# Patient Record
Sex: Male | Born: 1975 | Race: Black or African American | Hispanic: No | Smoking: Current every day smoker
Health system: Southern US, Community
[De-identification: ages and names within clinical notes are randomized; demographics above are authoritative.]

## PROBLEM LIST (undated history)

## (undated) DIAGNOSIS — Z87442 Personal history of urinary calculi: Secondary | ICD-10-CM

## (undated) DIAGNOSIS — I10 Essential (primary) hypertension: Secondary | ICD-10-CM

---

## 2009-11-08 ENCOUNTER — Inpatient Hospital Stay: Payer: Self-pay | Admitting: Internal Medicine

## 2009-12-31 ENCOUNTER — Emergency Department: Payer: Self-pay | Admitting: Emergency Medicine

## 2011-08-07 IMAGING — CT CT HEAD WITHOUT CONTRAST
2 series · 16 of 30 positions shown, 20 images · non-contrast
Comparison: none

REASON FOR EXAM: seizure
COMMENTS:

[Series 2: without · axial · non-contrast · 0.42mm/px · z∈[+448,+572]mm · 13 of 31 slices shown, 17 images]
[im 3/31  brain]
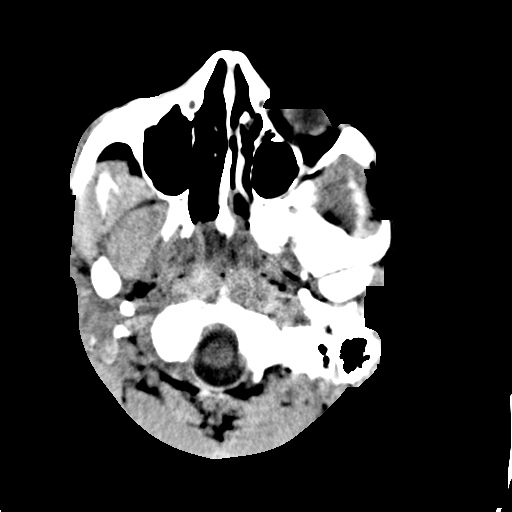
[im 3/31  bone]
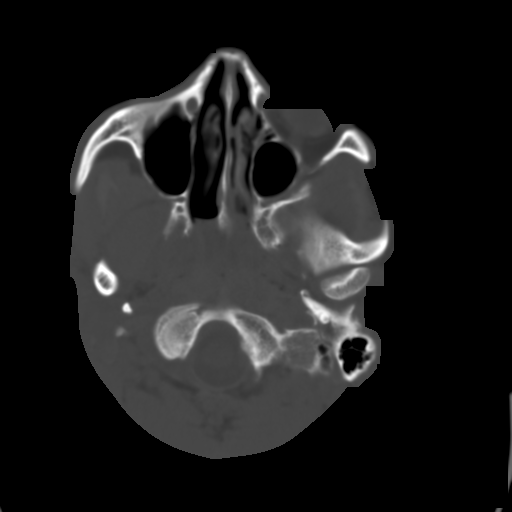
[im 5/31  brain]
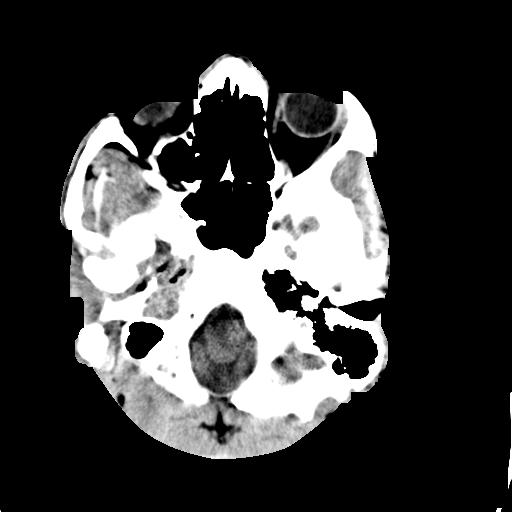
[im 7/31  brain]
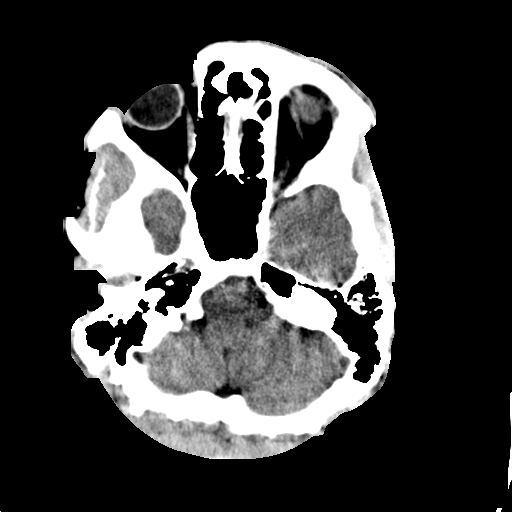
[im 9/31  brain]
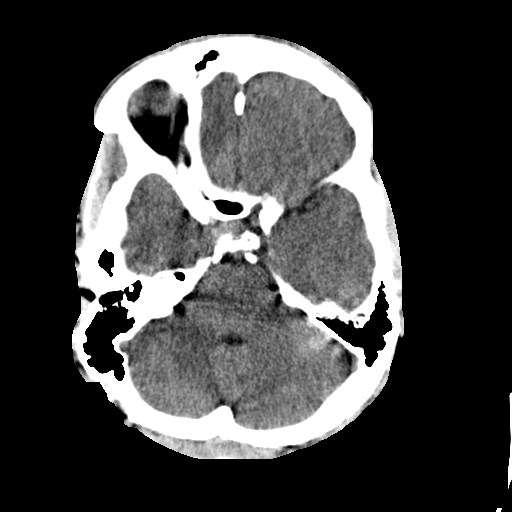
[im 11/31  brain]
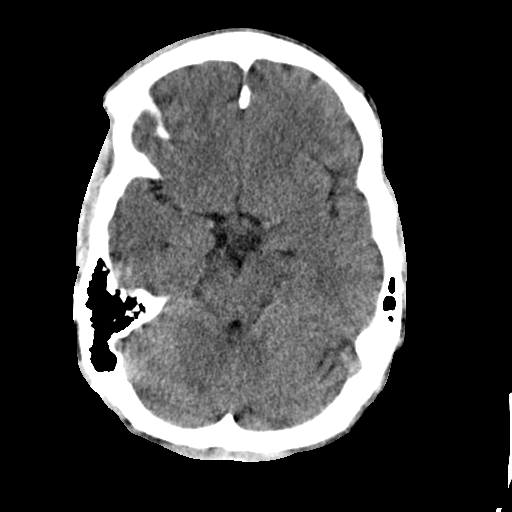
[im 11/31  bone]
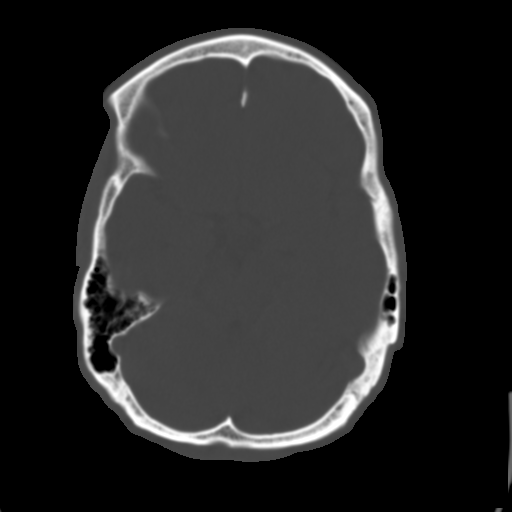
[im 13/31  brain]
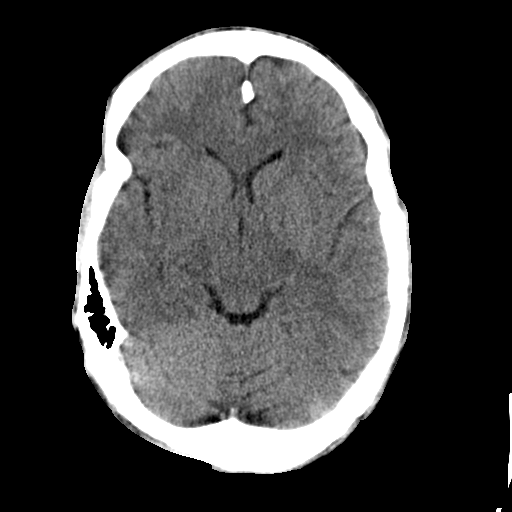
[im 16/31  brain]
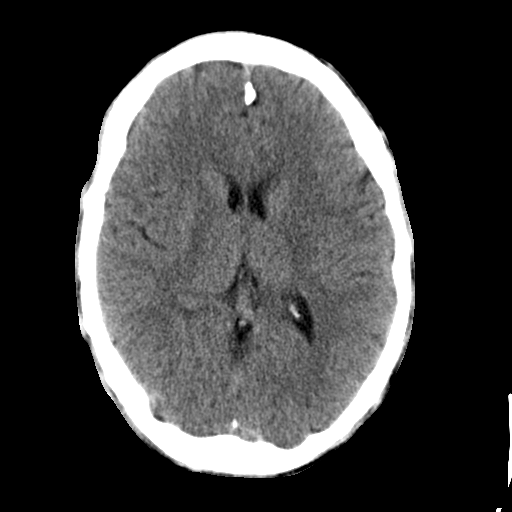
[im 18/31  brain]
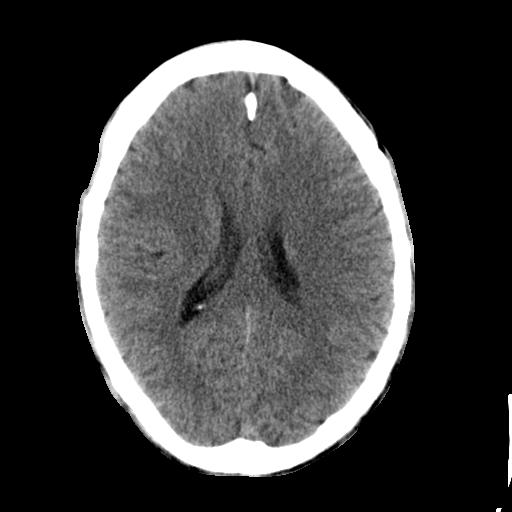
[im 20/31  brain]
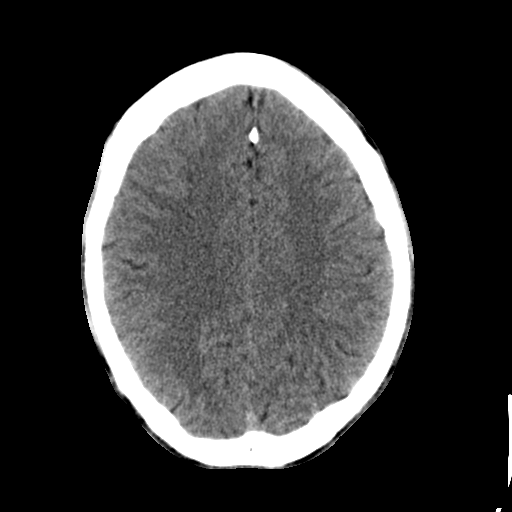
[im 20/31  bone]
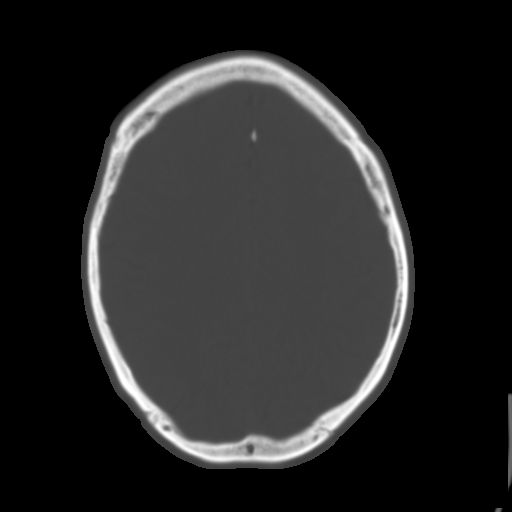
[im 22/31  brain]
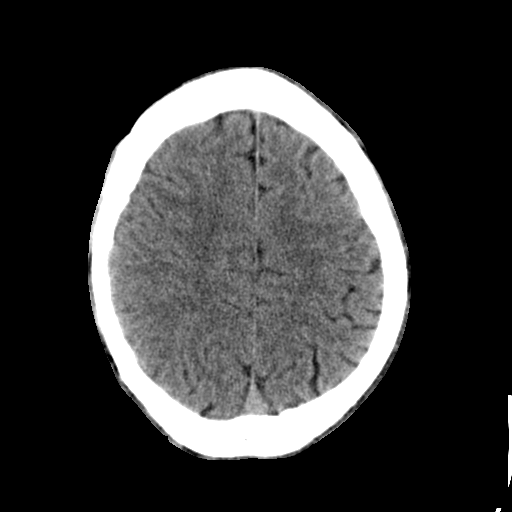
[im 24/31  brain]
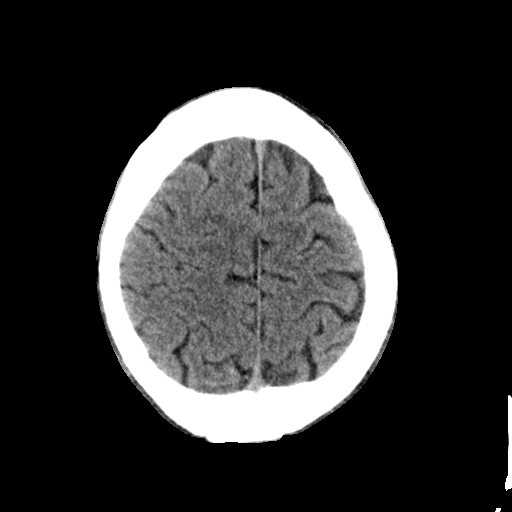
[im 26/31  brain]
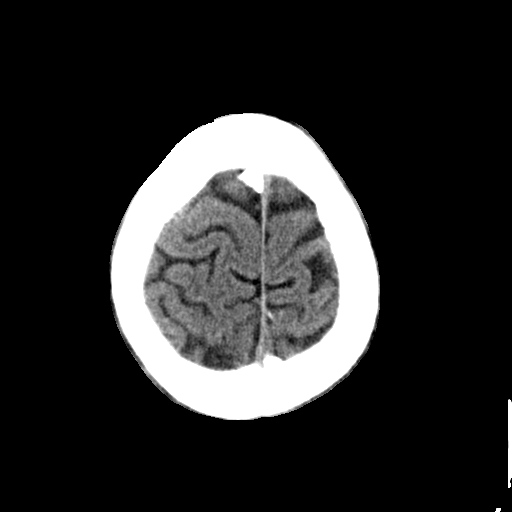
[im 28/31  brain]
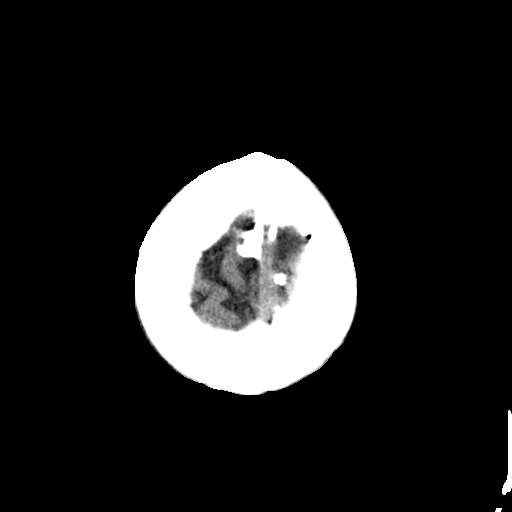
[im 28/31  bone]
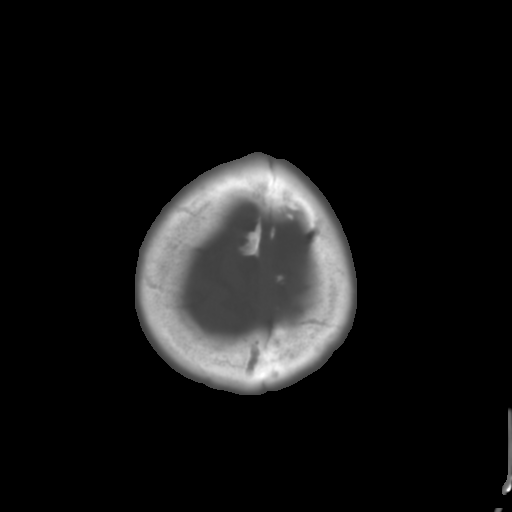

[Series 3: bone · axial · 0.42mm/px · z∈[+448,+488]mm · 3 of 31 slices shown]
[im 3/31  bone]
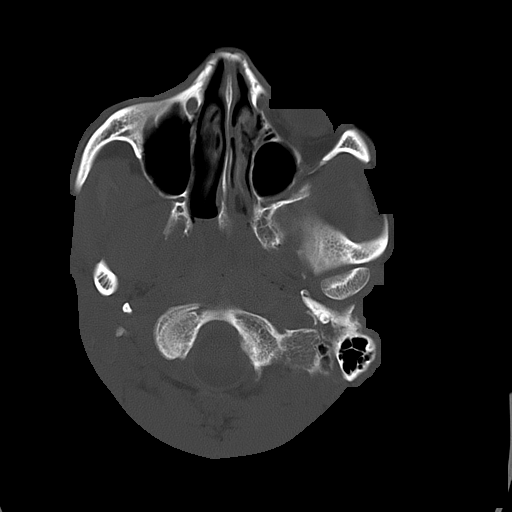
[im 7/31  bone]
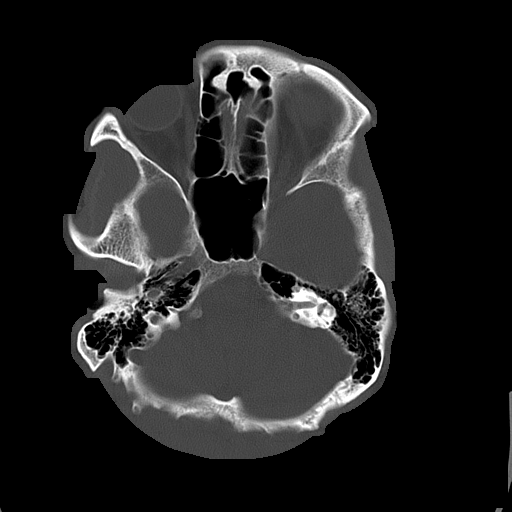
[im 11/31  bone]
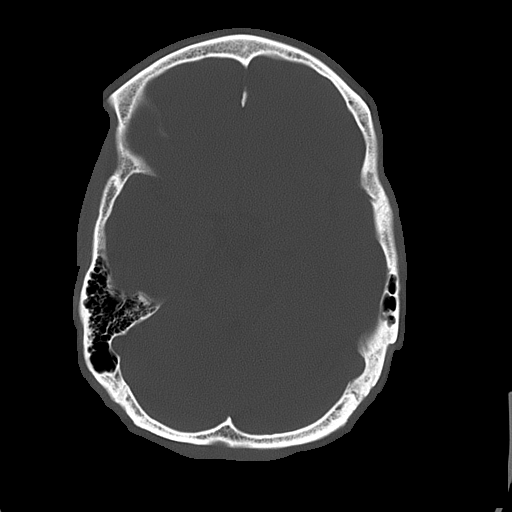

[16 of 30 positions shown; findings below may reference images not displayed]

PROCEDURE:     CT  - CT HEAD WITHOUT CONTRAST  - November 08, 2009  [DATE]

RESULT:     Axial noncontrast CT scanning was performed through the brain at
5 mm intervals and slice thicknesses.

The ventricles are normal in size and position. There is no intracranial
hemorrhage nor intracranial mass effect. The cerebellum and brainstem are
normal in density. There are no findings to suggest an evolving ischemic
event. I do not see evidence of increased intracranial pressure. At bone
window settings the observed portions of the paranasal sinuses and mastoid
air cells are clear. There is no evidence of an acute skull fracture.
IMPRESSION: I see no acute intracranial abnormality.

## 2011-08-08 IMAGING — CR DG CHEST 1V PORT
1 series · 1 of 1 positions shown · non-contrast
Comparison: none

REASON FOR EXAM: COMMENTS:

[view not recorded]
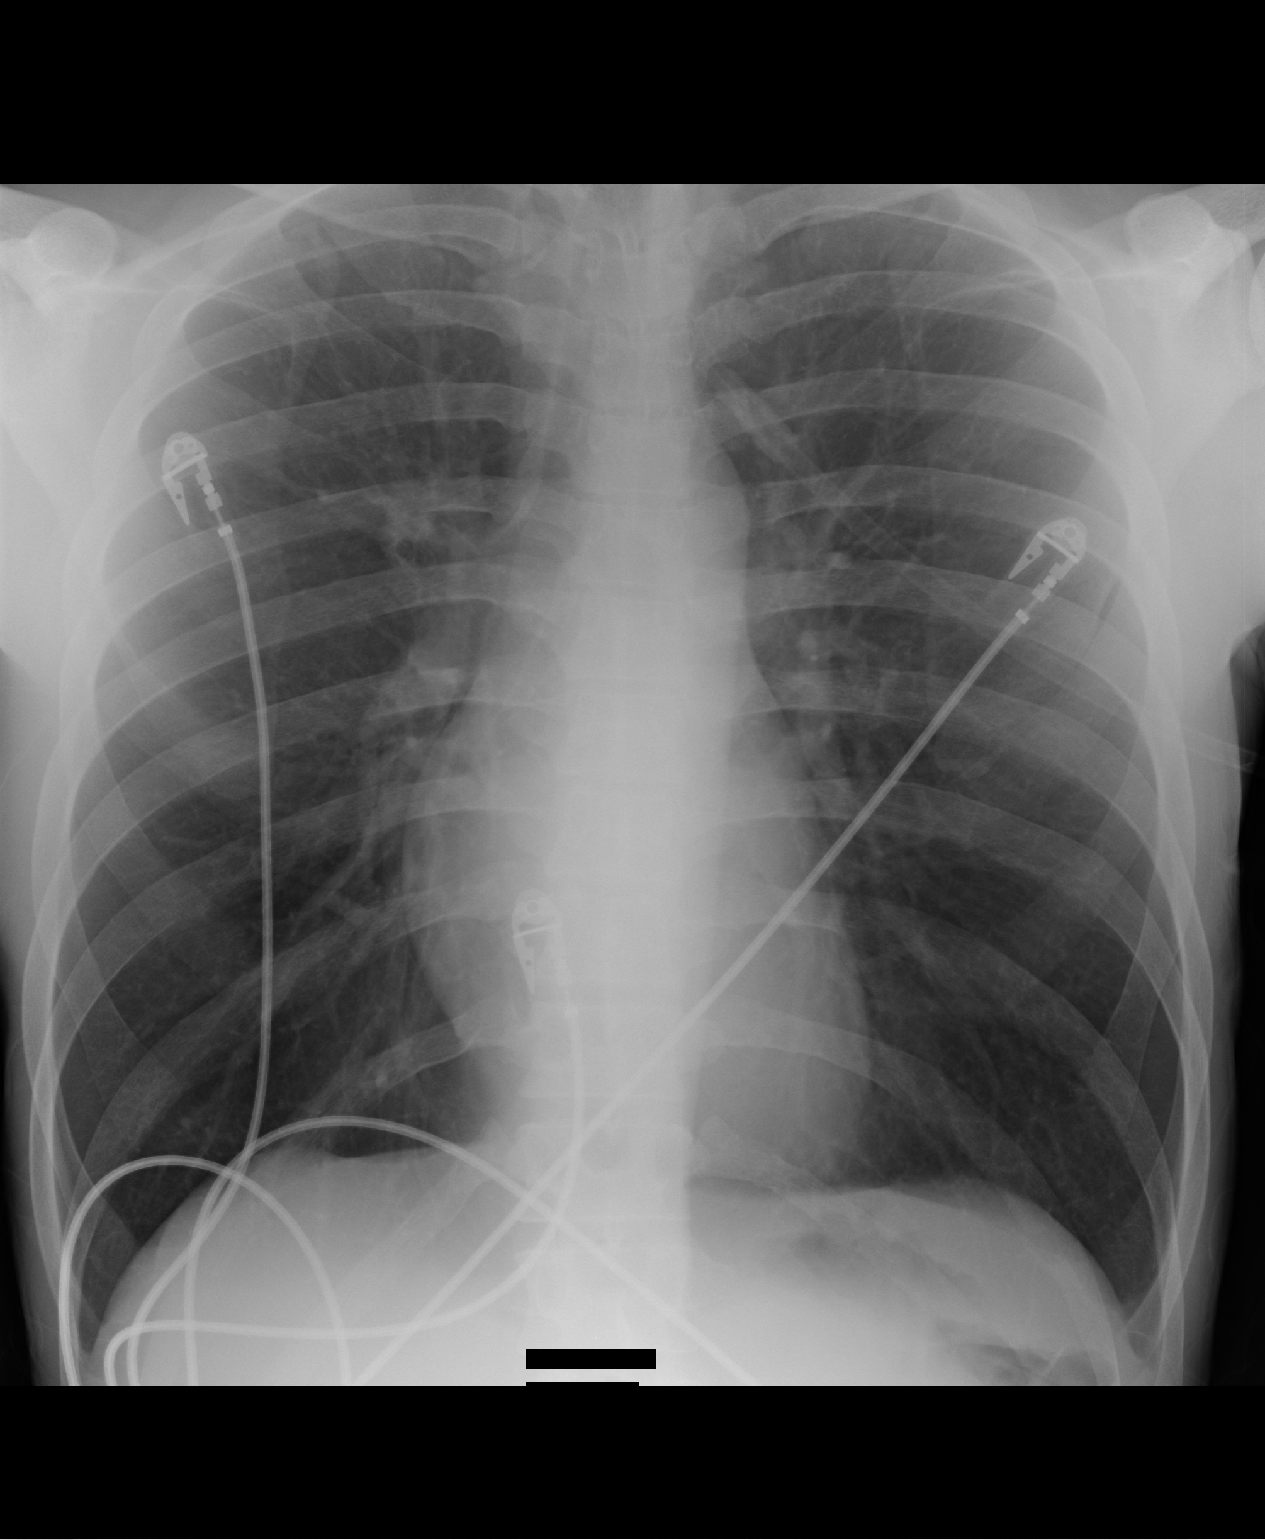

[1 of 1 positions shown; findings below may reference images not displayed]

PROCEDURE:     DXR - DXR PORTABLE CHEST SINGLE VIEW  - November 09, 2009  [DATE]

RESULT:      Comparison is made to the study of 09/14/2009.

The patient has undergone interval extubation of the trachea. The lungs
remain hyperinflated. The cardiac silhouette is normal in size. The
pulmonary vascularity is not engorged.
IMPRESSION: The findings are consistent with COPD. I do not see
evidence of pneumonia.

Addendum: The comparison study date indicated above is incorrect. The
previous chest x-ray was performed on 08 November, 2009.

## 2021-03-07 ENCOUNTER — Other Ambulatory Visit: Payer: Self-pay

## 2021-03-07 ENCOUNTER — Emergency Department
Admission: EM | Admit: 2021-03-07 | Discharge: 2021-03-07 | Disposition: A | Discharge status: Home / Self Care | Payer: Self-pay | Attending: Emergency Medicine | Admitting: Emergency Medicine

## 2021-03-07 ENCOUNTER — Encounter: Payer: Self-pay | Admitting: Emergency Medicine

## 2021-03-07 DIAGNOSIS — R21 Rash and other nonspecific skin eruption: Secondary | ICD-10-CM | POA: Insufficient documentation

## 2021-03-07 DIAGNOSIS — L539 Erythematous condition, unspecified: Secondary | ICD-10-CM | POA: Insufficient documentation

## 2021-03-07 MED ORDER — CEPHALEXIN 500 MG PO CAPS: 500.0000 mg | ORAL_CAPSULE | Freq: Three times a day (TID) | ORAL | 0 refills | Status: AC

## 2021-03-07 MED ORDER — DOXYCYCLINE MONOHYDRATE 100 MG PO TABS: 100.0000 mg | ORAL_TABLET | Freq: Two times a day (BID) | ORAL | 0 refills | Status: AC

## 2021-03-07 NOTE — ED Provider Notes (Signed)
[facility]-EMERGENCY DEPARTMENT  ____________________________________________  Time seen: Approximately 6:12 PM  I have reviewed the triage vital signs and the nursing notes.   HISTORY  Chief Complaint Rash   Historian Patient     HPI Jordan Dunn is a 45 y.o. male patient has erythematous, maculopapular rash of the face with honey colored crust primarily in chin and cheeks.  Patient reports that rash originally started with 2 pustules.  No fever or chills.  Patient has been applying calamine lotion.   History reviewed. No pertinent past medical history.   Immunizations up to date:  Yes.     History reviewed. No pertinent past medical history.  There are no problems to display for this patient.   History reviewed. No pertinent surgical history.  Prior to Admission medications   Medication Sig Start Date End Date Taking? Authorizing Provider  cephALEXin (KEFLEX) 500 MG capsule Take 1 capsule (500 mg total) by mouth 3 (three) times daily for 7 days. 03/07/21 03/14/21 Yes Pia Mau M, PA-C  doxycycline (ADOXA) 100 MG tablet Take 1 tablet (100 mg total) by mouth 2 (two) times daily for 7 days. 03/07/21 03/14/21 Yes Orvil Feil, PA-C    Allergies Patient has no allergy information on record.  No family history on file.  Social History     Review of Systems  Constitutional: No fever/chills Eyes:  No discharge ENT: No upper respiratory complaints. Respiratory: no cough. No SOB/ use of accessory muscles to breath Gastrointestinal:   No nausea, no vomiting.  No diarrhea.  No constipation. Musculoskeletal: Negative for musculoskeletal pain. Skin: Patient has facial rash.    ____________________________________________   PHYSICAL EXAM:  VITAL SIGNS: ED Triage Vitals  Enc Vitals Group     BP 03/07/21 1444 (!) 183/108     Pulse Rate 03/07/21 1444 93     Resp 03/07/21 1444 20     Temp 03/07/21 1444 99 F (37.2 C)     Temp Source 03/07/21 1444 Oral      SpO2 03/07/21 1444 96 %     Weight 03/07/21 1432 150 lb (68 kg)     Height 03/07/21 1432 6\' 2"  (1.88 m)     Head Circumference --      Peak Flow --      Pain Score 03/07/21 1432 10     Pain Loc --      Pain Edu? --      Excl. in GC? --      Constitutional: Alert and oriented. Well appearing and in no acute distress. Eyes: Conjunctivae are normal. PERRL. EOMI. Head: Atraumatic. ENT:      Nose: No congestion/rhinnorhea.      Mouth/Throat: Mucous membranes are moist.  Neck: No stridor.  No cervical spine tenderness to palpation. Cardiovascular: Normal rate, regular rhythm. Normal S1 and S2.  Good peripheral circulation. Respiratory: Normal respiratory effort without tachypnea or retractions. Lungs CTAB. Good air entry to the bases with no decreased or absent breath sounds Gastrointestinal: Bowel sounds x 4 quadrants. Soft and nontender to palpation. No guarding or rigidity. No distention. Musculoskeletal: Full range of motion to all extremities. No obvious deformities noted Neurologic:  Normal for age. No gross focal neurologic deficits are appreciated.  Skin: Patient has erythematous, maculopapular rash of face with honey colored crust. Psychiatric: Mood and affect are normal for age. Speech and behavior are normal.   ____________________________________________   LABS (all labs ordered are listed, but only abnormal results are displayed)  Labs Reviewed -  No data to display ____________________________________________  EKG   ____________________________________________  RADIOLOGY   No results found.  ____________________________________________    PROCEDURES  Procedure(s) performed:     Procedures     Medications - No data to display   ____________________________________________   INITIAL IMPRESSION / ASSESSMENT AND PLAN / ED COURSE  Pertinent labs & imaging results that were available during my care of the patient were reviewed by me and considered  in my medical decision making (see chart for details).      Assessment and plan Facial rash 45 year old male presents to the emergency department with facial rash consistent with impetigo.  Will treat with both doxycycline and Keflex.  A work note was provided.  All patient questions were answered.     ____________________________________________  FINAL CLINICAL IMPRESSION(S) / ED DIAGNOSES  Final diagnoses:  Rash      NEW MEDICATIONS STARTED DURING THIS VISIT:  ED Discharge Orders          Ordered    cephALEXin (KEFLEX) 500 MG capsule  3 times daily        03/07/21 1803    doxycycline (ADOXA) 100 MG tablet  2 times daily        03/07/21 1803                This chart was dictated using voice recognition software/Dragon. Despite best efforts to proofread, errors can occur which can change the meaning. Any change was purely unintentional.     Orvil Feil, PA-C 03/07/21 1814    Phineas Semen, MD 03/07/21 1944

## 2021-03-07 NOTE — ED Triage Notes (Signed)
Pt reports 4 days ago started with a rash on his face. Pt reports does not itch and looks like little sores. Pt reports someone told him it may be a spider bite and to come to the ED

## 2021-03-07 NOTE — Discharge Instructions (Addendum)
Take doxycycline twice daily for the next 7 days. Take Keflex 4 times daily for the next 7 days. 

## 2023-05-03 ENCOUNTER — Other Ambulatory Visit: Payer: Self-pay

## 2023-05-03 ENCOUNTER — Encounter: Payer: Self-pay | Admitting: Emergency Medicine

## 2023-05-03 ENCOUNTER — Emergency Department
Admission: EM | Admit: 2023-05-03 | Discharge: 2023-05-03 | Disposition: A | Discharge status: Home / Self Care | Payer: Self-pay | Attending: Emergency Medicine | Admitting: Emergency Medicine

## 2023-05-03 DIAGNOSIS — I10 Essential (primary) hypertension: Secondary | ICD-10-CM | POA: Insufficient documentation

## 2023-05-03 DIAGNOSIS — R040 Epistaxis: Secondary | ICD-10-CM | POA: Insufficient documentation

## 2023-05-03 MED ORDER — LORAZEPAM 1 MG PO TABS
1.0000 mg | ORAL_TABLET | Freq: Once | ORAL | Status: AC
  Administered 2023-05-03: 1 mg via ORAL
  Filled 2023-05-03: qty 1

## 2023-05-03 MED ORDER — CLONIDINE HCL 0.1 MG PO TABS
0.1000 mg | ORAL_TABLET | Freq: Once | ORAL | Status: AC
  Administered 2023-05-03: 0.1 mg via ORAL
  Filled 2023-05-03: qty 1

## 2023-05-03 MED ORDER — OXYMETAZOLINE HCL 0.05 % NA SOLN
2.0000 | Freq: Once | NASAL | Status: AC
  Administered 2023-05-03: 2 via NASAL
  Filled 2023-05-03: qty 30

## 2023-05-03 MED ORDER — AMLODIPINE BESYLATE 5 MG PO TABS: 5.0000 mg | ORAL_TABLET | Freq: Every day | ORAL | 5 refills | Status: DC

## 2023-05-03 NOTE — ED Notes (Signed)
Patient discharged at this time. Ambulated to lobby with independent and steady gait. Breathing unlabored speaking in full sentences. Verbalized understanding of all discharge, follow up, and medication teaching. Discharged homed with all belongings.

## 2023-05-03 NOTE — Discharge Instructions (Signed)
Please begin taking her blood pressure medication as prescribed.  You should be receiving a phone call to help arrange a primary care appointment.  Return to the emergency department for any further significant bleeding or any other symptom personally concerning to yourself.

## 2023-05-03 NOTE — ED Provider Notes (Signed)
[facility] Provider Note    Event Date/Time   First MD Initiated Contact with Patient 05/03/23 1824     (approximate)  History   Chief Complaint: Epistaxis  HPI  Jordan Dunn is a 47 y.o. male with no past medical history presents to the emergency department for a nosebleed.  According to the patient earlier this morning he states he woke up to his nose bleeding, it stopped after 30 minutes or so and had remained stopped until around 4:00 today when he began bleeding once again.  Patient blood Kleenex is in his nose and states that has not restarted significantly again.  Patient found to be significantly hypertensive upon arrival 225/136.  Patient has been told in the past that he has high blood pressure but states it was never high enough to need any medication.  Patient denies any chest pain or shortness of breath.  No headache.  Patient is anxious appearing regarding the nosebleed.  Physical Exam   Triage Vital Signs: ED Triage Vitals  Encounter Vitals Group     BP 05/03/23 1645 (!) 225/136     Systolic BP Percentile --      Diastolic BP Percentile --      Pulse Rate 05/03/23 1645 97     Resp 05/03/23 1645 18     Temp 05/03/23 1645 97.6 F (36.4 C)     Temp Source 05/03/23 1645 Oral     SpO2 05/03/23 1645 99 %     Weight 05/03/23 1644 165 lb (74.8 kg)     Height 05/03/23 1644 6\' 2"  (1.88 m)     Head Circumference --      Peak Flow --      Pain Score 05/03/23 1644 0     Pain Loc --      Pain Education --      Exclude from Growth Chart --     Most recent vital signs: Vitals:   05/03/23 1645  BP: (!) 225/136  Pulse: 97  Resp: 18  Temp: 97.6 F (36.4 C)  SpO2: 99%    General: Awake, no distress.  CV:  Good peripheral perfusion.  Regular rate and rhythm  Resp:  Normal effort.  Equal breath sounds bilaterally.  Abd:  No distention.   Other:  No bleeding currently.  Dried blood bilaterally.   ED Results / Procedures / Treatments    MEDICATIONS ORDERED IN ED: Medications  cloNIDine (CATAPRES) tablet 0.1 mg (has no administration in time range)  LORazepam (ATIVAN) tablet 1 mg (has no administration in time range)  oxymetazoline (AFRIN) 0.05 % nasal spray 2 spray (has no administration in time range)     IMPRESSION / MDM / ASSESSMENT AND PLAN / ED COURSE  I reviewed the triage vital signs and the nursing notes.  Patient's presentation is most consistent with acute illness / injury with system symptoms.  Patient presents the emergency department for nosebleed intermittent since this morning.  Patient found to be quite hypertensive 225/136.  Patient has been told in the past he had a high blood pressure but states it was not high enough to need medications.  Patient denies any drug use besides marijuana.  No prescription medications.  Patient is anxious appearing during my evaluation likely contributing to his hypertension which could very likely be contributing to his epistaxis.  Currently hemostatic we will apply Afrin to promote continued hemostasis.  Will dose 0.1 mg of clonidine and 1 mg of Ativan orally  and reassess.  Patient agreeable to plan of care.  Patient's blood pressure is improving currently 162/107.  Has not had any further bleeding.  Will discharge home on amlodipine 5 mg daily.  Discussed with the patient to check his blood pressure every 3 to 4 days at the pharmacy to ensure it does not drop below 120 if it does he will discontinue the medication otherwise I will put in a referral to a new primary care doctor.  Patient agreeable to plan of care.  FINAL CLINICAL IMPRESSION(S) / ED DIAGNOSES   Uncontrolled hypertension Epistaxis    Note:  This document was prepared using Dragon voice recognition software and may include unintentional dictation errors.   Minna Antis, MD 05/03/23 2013

## 2023-05-03 NOTE — ED Notes (Signed)
See triage note  Presents with nose bleed  States this is the 2nd one today Denies any trauma

## 2023-05-03 NOTE — ED Triage Notes (Signed)
Patient to ED via POV for nosebleed. Second one today- started about 1 hr ago in both nostrils. Denies blood thinners

## 2024-04-18 ENCOUNTER — Encounter: Payer: Self-pay | Admitting: Intensive Care

## 2024-04-18 ENCOUNTER — Observation Stay: Payer: Self-pay | Admitting: Certified Registered Nurse Anesthetist

## 2024-04-18 ENCOUNTER — Encounter: Admission: EM | Disposition: A | Payer: Self-pay | Source: Home / Self Care | Attending: Student

## 2024-04-18 ENCOUNTER — Other Ambulatory Visit: Payer: Self-pay

## 2024-04-18 ENCOUNTER — Observation Stay: Payer: Self-pay

## 2024-04-18 ENCOUNTER — Inpatient Hospital Stay
Admission: EM | Admit: 2024-04-18 | Discharge: 2024-04-20 | DRG: 854 | Disposition: A | Discharge status: Home / Self Care | Payer: Self-pay | Attending: Student | Admitting: Student

## 2024-04-18 ENCOUNTER — Emergency Department: Payer: Self-pay

## 2024-04-18 DIAGNOSIS — N139 Obstructive and reflux uropathy, unspecified: Secondary | ICD-10-CM | POA: Insufficient documentation

## 2024-04-18 DIAGNOSIS — I10 Essential (primary) hypertension: Secondary | ICD-10-CM | POA: Diagnosis present

## 2024-04-18 DIAGNOSIS — N201 Calculus of ureter: Secondary | ICD-10-CM

## 2024-04-18 DIAGNOSIS — N179 Acute kidney failure, unspecified: Secondary | ICD-10-CM | POA: Diagnosis present

## 2024-04-18 DIAGNOSIS — A419 Sepsis, unspecified organism: Principal | ICD-10-CM | POA: Diagnosis present

## 2024-04-18 DIAGNOSIS — N10 Acute tubulo-interstitial nephritis: Secondary | ICD-10-CM | POA: Insufficient documentation

## 2024-04-18 DIAGNOSIS — N12 Tubulo-interstitial nephritis, not specified as acute or chronic: Principal | ICD-10-CM

## 2024-04-18 DIAGNOSIS — N200 Calculus of kidney: Secondary | ICD-10-CM

## 2024-04-18 DIAGNOSIS — N136 Pyonephrosis: Secondary | ICD-10-CM | POA: Diagnosis present

## 2024-04-18 DIAGNOSIS — Z79899 Other long term (current) drug therapy: Secondary | ICD-10-CM

## 2024-04-18 DIAGNOSIS — K5909 Other constipation: Secondary | ICD-10-CM | POA: Diagnosis present

## 2024-04-18 HISTORY — DX: Essential (primary) hypertension: I10

## 2024-04-18 HISTORY — PX: CYSTOSCOPY W/ URETERAL STENT PLACEMENT: SHX1429

## 2024-04-18 LAB — HIV ANTIBODY (ROUTINE TESTING W REFLEX): HIV Screen 4th Generation wRfx: NONREACTIVE

## 2024-04-18 LAB — URINALYSIS, ROUTINE W REFLEX MICROSCOPIC
Bilirubin Urine: NEGATIVE
Glucose, UA: NEGATIVE mg/dL
Hgb urine dipstick: NEGATIVE
Ketones, ur: NEGATIVE mg/dL
Leukocytes,Ua: NEGATIVE
Nitrite: NEGATIVE
Protein, ur: 30 mg/dL — AB
Specific Gravity, Urine: 1.033 — ABNORMAL HIGH (ref 1.005–1.030)
pH: 5 (ref 5.0–8.0)

## 2024-04-18 LAB — COMPREHENSIVE METABOLIC PANEL WITH GFR
ALT: 26 U/L (ref 0–44)
AST: 21 U/L (ref 15–41)
Albumin: 3.9 g/dL (ref 3.5–5.0)
Alkaline Phosphatase: 124 U/L (ref 38–126)
Anion gap: 12 (ref 5–15)
BUN: 25 mg/dL — ABNORMAL HIGH (ref 6–20)
CO2: 26 mmol/L (ref 22–32)
Calcium: 9.2 mg/dL (ref 8.9–10.3)
Chloride: 98 mmol/L (ref 98–111)
Creatinine, Ser: 1.85 mg/dL — ABNORMAL HIGH (ref 0.61–1.24)
GFR, Estimated: 45 mL/min — ABNORMAL LOW (ref >60)
Glucose, Bld: 119 mg/dL — ABNORMAL HIGH (ref 70–99)
Potassium: 4.1 mmol/L (ref 3.5–5.1)
Sodium: 135 mmol/L (ref 135–145)
Total Bilirubin: 0.6 mg/dL (ref 0.0–1.2)
Total Protein: 8.1 g/dL (ref 6.5–8.1)

## 2024-04-18 LAB — CBC
HCT: 39.1 % (ref 39.0–52.0)
Hemoglobin: 13.2 g/dL (ref 13.0–17.0)
MCH: 30.1 pg (ref 26.0–34.0)
MCHC: 33.8 g/dL (ref 30.0–36.0)
MCV: 89.3 fL (ref 80.0–100.0)
Platelets: 289 K/uL (ref 150–400)
RBC: 4.38 MIL/uL (ref 4.22–5.81)
RDW: 13.8 % (ref 11.5–15.5)
WBC: 22.3 K/uL — ABNORMAL HIGH (ref 4.0–10.5)
nRBC: 0 % (ref 0.0–0.2)

## 2024-04-18 LAB — LACTIC ACID, PLASMA
Lactic Acid, Venous: 0.8 mmol/L (ref 0.5–1.9)
Lactic Acid, Venous: 0.9 mmol/L (ref 0.5–1.9)

## 2024-04-18 LAB — LIPASE, BLOOD: Lipase: 21 U/L (ref 11–51)

## 2024-04-18 MED ORDER — HYDROMORPHONE HCL 1 MG/ML IJ SOLN: 0.5000 mg | INTRAMUSCULAR | Status: DC | PRN

## 2024-04-18 MED ORDER — INFLUENZA VIRUS VACC SPLIT PF (FLUZONE) 0.5 ML IM SUSY: 0.5000 mL | PREFILLED_SYRINGE | INTRAMUSCULAR | Status: DC

## 2024-04-18 MED ORDER — ONDANSETRON HCL 4 MG/2ML IJ SOLN
4.0000 mg | Freq: Four times a day (QID) | INTRAMUSCULAR | Status: DC | PRN
Start: 2024-04-18 — End: 2024-04-20

## 2024-04-18 MED ORDER — IOHEXOL 300 MG/ML  SOLN
100.0000 mL | Freq: Once | INTRAMUSCULAR | Status: AC | PRN
  Administered 2024-04-18: 100 mL via INTRAVENOUS

## 2024-04-18 MED ORDER — FENTANYL CITRATE (PF) 100 MCG/2ML IJ SOLN
INTRAMUSCULAR | Status: DC | PRN
  Administered 2024-04-18: 50 ug via INTRAVENOUS

## 2024-04-18 MED ORDER — ONDANSETRON HCL 4 MG/2ML IJ SOLN
INTRAMUSCULAR | Status: DC | PRN
  Administered 2024-04-18: 4 mg via INTRAVENOUS

## 2024-04-18 MED ORDER — SODIUM CHLORIDE 0.9 % IV BOLUS
1000.0000 mL | Freq: Once | INTRAVENOUS | Status: AC
  Administered 2024-04-18: 1000 mL via INTRAVENOUS

## 2024-04-18 MED ORDER — PROPOFOL 10 MG/ML IV BOLUS
INTRAVENOUS | Status: DC | PRN
Start: 2024-04-18 — End: 2024-04-18
  Administered 2024-04-18: 180 mg via INTRAVENOUS

## 2024-04-18 MED ORDER — FENTANYL CITRATE (PF) 100 MCG/2ML IJ SOLN: 25.0000 ug | INTRAMUSCULAR | Status: DC | PRN

## 2024-04-18 MED ORDER — OXYCODONE HCL 5 MG PO TABS: 5.0000 mg | ORAL_TABLET | Freq: Once | ORAL | Status: DC | PRN

## 2024-04-18 MED ORDER — MIDAZOLAM HCL 2 MG/2ML IJ SOLN
INTRAMUSCULAR | Status: AC
  Filled 2024-04-18: qty 2

## 2024-04-18 MED ORDER — PROPOFOL 10 MG/ML IV BOLUS
INTRAVENOUS | Status: AC
  Filled 2024-04-18: qty 20

## 2024-04-18 MED ORDER — FENTANYL CITRATE (PF) 100 MCG/2ML IJ SOLN
INTRAMUSCULAR | Status: AC
  Filled 2024-04-18: qty 2

## 2024-04-18 MED ORDER — DEXAMETHASONE SOD PHOSPHATE PF 10 MG/ML IJ SOLN
INTRAMUSCULAR | Status: DC | PRN
  Administered 2024-04-18: 10 mg via INTRAVENOUS

## 2024-04-18 MED ORDER — DEXMEDETOMIDINE HCL IN NACL 80 MCG/20ML IV SOLN
INTRAVENOUS | Status: DC | PRN
Start: 2024-04-18 — End: 2024-04-18
  Administered 2024-04-18: 4 ug via INTRAVENOUS

## 2024-04-18 MED ORDER — PIPERACILLIN-TAZOBACTAM 3.375 G IVPB 30 MIN
3.3750 g | Freq: Once | INTRAVENOUS | Status: AC
Start: 2024-04-18 — End: 2024-04-18
  Administered 2024-04-18: 3.375 g via INTRAVENOUS
  Filled 2024-04-18: qty 50

## 2024-04-18 MED ORDER — ACETAMINOPHEN 10 MG/ML IV SOLN: 1000.0000 mg | Freq: Once | INTRAVENOUS | Status: DC | PRN

## 2024-04-18 MED ORDER — STERILE WATER FOR IRRIGATION IR SOLN
Status: DC | PRN
  Administered 2024-04-18: 500 mL

## 2024-04-18 MED ORDER — ACETAMINOPHEN 325 MG PO TABS: 650.0000 mg | ORAL_TABLET | Freq: Four times a day (QID) | ORAL | Status: DC | PRN

## 2024-04-18 MED ORDER — LIDOCAINE HCL (CARDIAC) PF 100 MG/5ML IV SOSY
PREFILLED_SYRINGE | INTRAVENOUS | Status: DC | PRN
  Administered 2024-04-18: 80 mg via INTRAVENOUS

## 2024-04-18 MED ORDER — SODIUM CHLORIDE 0.9 % IV SOLN
2.0000 g | INTRAVENOUS | Status: DC
  Administered 2024-04-18 – 2024-04-19 (×2): 2 g via INTRAVENOUS
  Filled 2024-04-18 (×3): qty 20

## 2024-04-18 MED ORDER — ONDANSETRON HCL 4 MG PO TABS: 4.0000 mg | ORAL_TABLET | Freq: Four times a day (QID) | ORAL | Status: DC | PRN

## 2024-04-18 MED ORDER — LABETALOL HCL 5 MG/ML IV SOLN
10.0000 mg | Freq: Once | INTRAVENOUS | Status: AC
  Administered 2024-04-18: 10 mg via INTRAVENOUS
  Filled 2024-04-18: qty 4

## 2024-04-18 MED ORDER — PIPERACILLIN-TAZOBACTAM 4.5 G IVPB
4.5000 g | Freq: Once | INTRAVENOUS | Status: DC
  Filled 2024-04-18: qty 100

## 2024-04-18 MED ORDER — DROPERIDOL 2.5 MG/ML IJ SOLN: 0.6250 mg | Freq: Once | INTRAMUSCULAR | Status: DC | PRN

## 2024-04-18 MED ORDER — MIDAZOLAM HCL (PF) 2 MG/2ML IJ SOLN
INTRAMUSCULAR | Status: DC | PRN
  Administered 2024-04-18: 2 mg via INTRAVENOUS

## 2024-04-18 MED ORDER — SODIUM CHLORIDE 0.9 % IR SOLN
Status: DC | PRN
  Administered 2024-04-18: 3000 mL

## 2024-04-18 MED ORDER — LABETALOL HCL 5 MG/ML IV SOLN
10.0000 mg | Freq: Once | INTRAVENOUS | Status: AC
  Administered 2024-04-18: 10 mg via INTRAVENOUS

## 2024-04-18 MED ORDER — LACTATED RINGERS IV SOLN
INTRAVENOUS | Status: DC | PRN
Start: 2024-04-18 — End: 2024-04-18

## 2024-04-18 MED ORDER — BISACODYL 5 MG PO TBEC
5.0000 mg | DELAYED_RELEASE_TABLET | Freq: Every day | ORAL | Status: DC | PRN
Start: 2024-04-18 — End: 2024-04-20

## 2024-04-18 MED ORDER — LABETALOL HCL 5 MG/ML IV SOLN
INTRAVENOUS | Status: AC
  Filled 2024-04-18: qty 4

## 2024-04-18 MED ORDER — OXYCODONE HCL 5 MG/5ML PO SOLN: 5.0000 mg | Freq: Once | ORAL | Status: DC | PRN

## 2024-04-18 MED ORDER — LABETALOL HCL 5 MG/ML IV SOLN
10.0000 mg | INTRAVENOUS | Status: DC | PRN
  Administered 2024-04-18 – 2024-04-20 (×5): 10 mg via INTRAVENOUS
  Filled 2024-04-18 (×5): qty 4

## 2024-04-18 MED ORDER — ACETAMINOPHEN 325 MG PO TABS
650.0000 mg | ORAL_TABLET | Freq: Once | ORAL | Status: AC
  Administered 2024-04-18: 650 mg via ORAL
  Filled 2024-04-18: qty 2

## 2024-04-18 MED ORDER — ACETAMINOPHEN 650 MG RE SUPP: 650.0000 mg | Freq: Four times a day (QID) | RECTAL | Status: DC | PRN

## 2024-04-18 MED ORDER — IOHEXOL 180 MG/ML  SOLN
INTRAMUSCULAR | Status: DC | PRN
  Administered 2024-04-18: 10 mL

## 2024-04-18 MED ORDER — MORPHINE SULFATE (PF) 4 MG/ML IV SOLN
4.0000 mg | Freq: Once | INTRAVENOUS | Status: AC
  Administered 2024-04-18: 4 mg via INTRAVENOUS
  Filled 2024-04-18: qty 1

## 2024-04-18 NOTE — Consult Note (Signed)
 Urology Consult   I have been asked to see the patient by Dr. Fernand, for evaluation and management of left ureteral stone, fever, UTI.  Chief Complaint: Left lower quadrant pain, fever, nausea, malaise  HPI:  Jordan Dunn is a 48 y.o. male with past medical history notable for hypertension who presented to the ER today with 1 week of left lower quadrant pain, nausea/vomiting, decreased urine output, and malaise.  Evaluation in the ER with CT scan showed a 6 mm left distal ureteral stone with significant left hydronephrosis and left perinephric stranding.  Elevated leukocytosis to 22k and AKI with creatinine of 1.85, eGFR 45, urinalysis 6-10 RBC, 6-10 WBC, rare bacteria.  Reports fevers at home.  Denies any chest pain or shortness of breath.  No prior stone episodes.  PMH: Past Medical History:  Diagnosis Date   Hypertension     Surgical History: History reviewed. No pertinent surgical history.   Allergies: No Known Allergies  Family History: History reviewed. No pertinent family history.  Social History:  reports that he has an unknown smoking status. He has never used smokeless tobacco. He reports that he does not currently use alcohol. He reports that he does not use drugs.   Physical Exam: BP (!) 195/112   Pulse 97   Temp 100.1 F (37.8 C) (Oral)   Resp (!) 24   SpO2 91%    Constitutional:  Alert and oriented, No acute distress. Cardiovascular: Tachycardic, regular rhythm Respiratory: Clear to auscultation bilaterally GI: Abdomen is soft, nontender, nondistended, no abdominal masses GU: Left CVA tenderness Lymph: No cervical or inguinal lymphadenopathy. Skin: No rashes, bruises or suspicious lesions. Neurologic: Grossly intact, no focal deficits, moving all 4 extremities. Psychiatric: Normal mood and affect.  Laboratory Data: Reviewed, see HPI  Pertinent Imaging: I have personally reviewed the CT scan showing a collection of stones in the left distal  ureter, largest 6 mm with moderate to severe left hydronephrosis and fairly significant left perinephric stranding.  Assessment & Plan:   47 year old male with 1 week of left-sided abdominal pain and nausea, fevers at home.  Presentation in ER consistent with 6 mm left distal ureteral stone with upstream obstruction and likely infection based on his fever and leukocytosis.  We discussed the need for drainage in the setting of an infected and obstructed system.  A ureteral stent is a small plastic tube that is placed cystoscopically with one end in the kidney and the other end in the bladder that allows the infection from the kidney to drain, and relieves pain from the obstructing stone.  We discussed the risks at length including bleeding, infection, sepsis, death, ureteral injury, and stent related symptoms including urgency/frequency/dysuria/flank pain/gross hematuria.  There is a low, but not 0, risk of inability to pass the ureteral stent alongside the stone from below which would require percutaneous nephrostomy tube by interventional radiology.  Finally, we discussed possible prolonged hospitalization and recovery, possible temporary Foley catheter placement, and 10 to 14-day course of antibiotics.  We reviewed the need for a follow-up procedure for definitive management of their stone when the infection has been treated in 2 to 3 weeks with ureteroscopy/laser lithotripsy.  Recommendations:  -Agree with antibiotics and hospitalist admission -OR today for left ureteral stent placement -Will ultimately require outpatient left ureteroscopy/laser lithotripsy in 2 to 4 weeks after infection treated  Redell JAYSON Burnet, MD  Total time spent on the floor was 6 minutes, with greater than 50% spent in counseling  and coordination of care with the patient regarding left ureteral stone with UTI/sepsis from urinary source and need for urgent stent placement with delayed definitive treatment after infection  treated, and admission to hospital for antibiotics and monitoring.  [facility] [address] [place], [place] [address] [phone]

## 2024-04-18 NOTE — Sepsis Progress Note (Signed)
 Code Sepsis protocol being monitored by eLink. Blood cultures, 2 lactic acid, 1L LR and zosyn completed before code sepsis called.

## 2024-04-18 NOTE — ED Provider Notes (Signed)
 [facility] Provider Note    Event Date/Time   First MD Initiated Contact with Patient 04/18/24 1014     (approximate)   History   Abdominal Pain   HPI  LADARRIUS BOGDANSKI is a 48 y.o. male abdominal pain.  Patient states that since Friday having persistent abdominal pain, seems that it may have remitted for 1 to 2 days but then recurred again primarily in the left side of the abdomen.  Feeling with nausea vomiting poor p.o. intake, has not been able to have any bowel movement since Friday he tells me.  He has also had infrequent urination.  Has not experienced any fevers or chills, no cough no known sick contacts denies other surgical history.  Endorses a history of high blood pressure but no other medical history and endorses regular tobacco use but denies any alcohol or illicit substance use.     Physical Exam   Triage Vital Signs: ED Triage Vitals  Encounter Vitals Group     BP 04/18/24 0915 (!) 176/116     Girls Systolic BP Percentile --      Girls Diastolic BP Percentile --      Boys Systolic BP Percentile --      Boys Diastolic BP Percentile --      Pulse Rate 04/18/24 0915 100     Resp 04/18/24 0915 17     Temp 04/18/24 0915 100 F (37.8 C)     Temp Source 04/18/24 0915 Oral     SpO2 04/18/24 0915 98 %     Weight --      Height --      Head Circumference --      Peak Flow --      Pain Score 04/18/24 0907 5     Pain Loc --      Pain Education --      Exclude from Growth Chart --     Most recent vital signs: Vitals:   04/18/24 1500 04/18/24 1515  BP: (!) 178/112 (!) 171/110  Pulse: 95 95  Resp: (!) 22 (!) 21  Temp:    SpO2: 94% (!) 89%     General: Awake, no distress.  CV:  Good peripheral perfusion.  Regular rate and rhythm Resp:  Normal effort.  Clear to auscultation bilaterally Abd:  No distention.  No significant rebound guarding, abdomen is soft Other:     ED Results / Procedures / Treatments   Labs (all labs ordered  are listed, but only abnormal results are displayed) Labs Reviewed  COMPREHENSIVE METABOLIC PANEL WITH GFR - Abnormal; Notable for the following components:      Result Value   Glucose, Bld 119 (*)    BUN 25 (*)    Creatinine, Ser 1.85 (*)    GFR, Estimated 45 (*)    All other components within normal limits  CBC - Abnormal; Notable for the following components:   WBC 22.3 (*)    All other components within normal limits  URINALYSIS, ROUTINE W REFLEX MICROSCOPIC - Abnormal; Notable for the following components:   Color, Urine YELLOW (*)    APPearance CLEAR (*)    Specific Gravity, Urine 1.033 (*)    Protein, ur 30 (*)    Bacteria, UA RARE (*)    All other components within normal limits  CULTURE, BLOOD (ROUTINE X 2)  CULTURE, BLOOD (ROUTINE X 2)  LIPASE, BLOOD  LACTIC ACID, PLASMA  LACTIC ACID, PLASMA  HIV ANTIBODY (ROUTINE  TESTING W REFLEX)     EKG  Sinus rhythm with rate of about 95, axis of about 90, intervals appear to be within normal limits, no obvious ischemia appreciated on this EKG   RADIOLOGY   PROCEDURES:  Critical Care performed: Yes, see critical care procedure note(s)  .Critical Care  Performed by: Fernand Rossie HERO, MD Authorized by: Fernand Rossie HERO, MD   Critical care provider statement:    Critical care time (minutes):  35   Critical care was necessary to treat or prevent imminent or life-threatening deterioration of the following conditions:  Sepsis   Care discussed with: admitting provider      MEDICATIONS ORDERED IN ED: Medications  labetalol (NORMODYNE) injection 10 mg (10 mg Intravenous Given 04/18/24 1506)  HYDROmorphone (DILAUDID) injection 0.5 mg (has no administration in time range)  cefTRIAXone (ROCEPHIN) 2 g in sodium chloride 0.9 % 100 mL IVPB (has no administration in time range)  acetaminophen (TYLENOL) tablet 650 mg (has no administration in time range)    Or  acetaminophen (TYLENOL) suppository 650 mg (has no administration in  time range)  ondansetron (ZOFRAN) tablet 4 mg (has no administration in time range)    Or  ondansetron (ZOFRAN) injection 4 mg (has no administration in time range)  bisacodyl (DULCOLAX) EC tablet 5 mg (has no administration in time range)  sodium chloride 0.9 % bolus 1,000 mL (0 mLs Intravenous Stopped 04/18/24 1229)  iohexol (OMNIPAQUE) 300 MG/ML solution 100 mL (100 mLs Intravenous Contrast Given 04/18/24 1159)  morphine (PF) 4 MG/ML injection 4 mg (4 mg Intravenous Given 04/18/24 1254)  acetaminophen (TYLENOL) tablet 650 mg (650 mg Oral Given 04/18/24 1330)  piperacillin-tazobactam (ZOSYN) IVPB 3.375 g (0 g Intravenous Stopped 04/18/24 1408)     IMPRESSION / MDM / ASSESSMENT AND PLAN / ED COURSE  I reviewed the triage vital signs and the nursing notes.                               Patient's presentation is most consistent with acute complicated illness / injury requiring diagnostic workup.  48 year old male who presents today with concern of abdominal pain ongoing for about 4 days.  Primarily in the left side of his abdomen, affiliated with nausea vomiting and constipation.  No surgical history.  Here he is hypertensive and oral temperature of 37.8.  He does have a significant leukocytosis as well and has developed an AKI possibly prerenal but no clear etiology as of yet.  Will obtain CT imaging with contrast to assess further for small bowel obstruction or other possible etiology.  Will follow-up remaining labs and determine further workup accordingly.   Clinical Course as of 04/18/24 1524  Wed Apr 18, 2024  1312 Patient pain was worsening, he is noted to be increasingly tachycardic and again with a temperature 100.1, CT imaging just resulted demonstrating evidence of nephrolithiasis although his urinalysis does not reveal evidence of a UTI but is the most likely source of symptoms at this time.  I will reach out to urology and discussed with them with starting antibiotics with plan  to admit to medicine service. [SK]  1324 Spoke with Dr. Francisca from urology who will come to evaluate the patient. Reached out to medicine for admission to their service. [SK]  1328 Spoke with the Zhang from the medicine service, they will come to evaluate the patient to determine course of further medical management at this time. [SK]  Clinical Course User Index [SK] Fernand Rossie HERO, MD     FINAL CLINICAL IMPRESSION(S) / ED DIAGNOSES   Final diagnoses:  Pyelonephritis  Sepsis, due to unspecified organism, unspecified whether acute organ dysfunction present [facility])  Nephrolithiasis     Rx / DC Orders   ED Discharge Orders     None        Note:  This document was prepared using Dragon voice recognition software and may include unintentional dictation errors.   Fernand Rossie HERO, MD 04/18/24 1524

## 2024-04-18 NOTE — Progress Notes (Signed)
 Urology Inpatient Progress Note  Subjective: No significant events overnight. He is afebrile, VSS. WBC count down, 18.2. Creatinine down, 1.37. Urine culture pending, blood cultures pending with no growth at <24 hours; on antibiotics as below. Foley catheter in place draining pink-tinged urine. Today he reports no discomfort or acute concerns.  Anti-infectives: Anti-infectives (From admission, onward)    Start     Dose/Rate Route Frequency Ordered Stop   04/18/24 1800  [MAR Hold]  cefTRIAXone (ROCEPHIN) 2 g in sodium chloride 0.9 % 100 mL IVPB        (MAR Hold since Wed 04/18/2024 at 1546.Hold Reason: Transfer to a Procedural area)   2 g 200 mL/hr over 30 Minutes Intravenous Every 24 hours 04/18/24 1408     04/18/24 1330  piperacillin-tazobactam (ZOSYN) IVPB 3.375 g        3.375 g 100 mL/hr over 30 Minutes Intravenous  Once 04/18/24 1319 04/18/24 1408   04/18/24 1315  piperacillin-tazobactam (ZOSYN) IVPB 4.5 g  Status:  Discontinued        4.5 g 200 mL/hr over 30 Minutes Intravenous  Once 04/18/24 1315 04/18/24 1319       Current Facility-Administered Medications  Medication Dose Route Frequency Provider Last Rate Last Admin   acetaminophen (OFIRMEV) IV 1,000 mg  1,000 mg Intravenous Once PRN Adams, James G, MD       [MAR Hold] acetaminophen (TYLENOL) tablet 650 mg  650 mg Oral Q6H PRN Laurita Cort DASEN, MD       Or   ILDA Hold] acetaminophen (TYLENOL) suppository 650 mg  650 mg Rectal Q6H PRN Laurita Cort DASEN, MD       [MAR Hold] bisacodyl (DULCOLAX) EC tablet 5 mg  5 mg Oral Daily PRN Laurita Cort DASEN, MD       [facility] Hold] cefTRIAXone (ROCEPHIN) 2 g in sodium chloride 0.9 % 100 mL IVPB  2 g Intravenous Q24H Laurita Cort T, MD       droperidol (INAPSINE) 2.5 MG/ML injection 0.625 mg  0.625 mg Intravenous Once PRN Adams, James G, MD       fentaNYL (SUBLIMAZE) injection 25 mcg  25 mcg Intravenous Q5 min PRN Adams, James G, MD       [MAR Hold] HYDROmorphone (DILAUDID) injection 0.5 mg  0.5 mg  Intravenous Q4H PRN Laurita Cort DASEN, MD       [facility] Hold] labetalol (NORMODYNE) injection 10 mg  10 mg Intravenous Q4H PRN Laurita Cort T, MD   10 mg at 04/18/24 1506   [MAR Hold] ondansetron (ZOFRAN) tablet 4 mg  4 mg Oral Q6H PRN Laurita Cort DASEN, MD       Or   ILDA Hold] ondansetron (ZOFRAN) injection 4 mg  4 mg Intravenous Q6H PRN Laurita Cort T, MD       oxyCODONE (Oxy IR/ROXICODONE) immediate release tablet 5 mg  5 mg Oral Once PRN Myra Lynwood MATSU, MD       Or   oxyCODONE (ROXICODONE) 5 MG/5ML solution 5 mg  5 mg Oral Once PRN Myra Lynwood MATSU, MD        Objective: Vital signs in last 24 hours: Temp:  [100 F (37.8 C)-101.1 F (38.4 C)] 101.1 F (38.4 C) (11/12 1547) Pulse Rate:  [94-103] 94 (11/12 1633) Resp:  [17-24] 20 (11/12 1547) BP: (169-198)/(102-119) 169/102 (11/12 1633) SpO2:  [89 %-100 %] 93 % (11/12 1547) Weight:  [74.8 kg] 74.8 kg (11/12 1547)  Intake/Output from previous day: No intake/output data recorded. Intake/Output  this shift: No intake/output data recorded.  Physical Exam Vitals and nursing note reviewed.  Constitutional:      General: He is not in acute distress.    Appearance: He is not ill-appearing, toxic-appearing or diaphoretic.  HENT:     Head: Normocephalic and atraumatic.  Pulmonary:     Effort: Pulmonary effort is normal. No respiratory distress.  Skin:    General: Skin is warm and dry.  Neurological:     Mental Status: He is alert and oriented to person, place, and time.  Psychiatric:        Mood and Affect: Mood normal.        Behavior: Behavior normal.    Lab Results:  Recent Labs    04/18/24 0916  WBC 22.3*  HGB 13.2  HCT 39.1  PLT 289   BMET Recent Labs    04/18/24 0916  NA 135  K 4.1  CL 98  CO2 26  GLUCOSE 119*  BUN 25*  CREATININE 1.85*  CALCIUM 9.2   Studies/Results: DG OR UROLOGY CYSTO IMAGE ([facility] ONLY) Result Date: 04/18/2024 There is no interpretation for this exam.  This order is for images obtained during a  surgical procedure.  Please See Surgeries Tab for more information regarding the procedure.   CT ABDOMEN PELVIS W CONTRAST Result Date: 04/18/2024 EXAM: CT ABDOMEN AND PELVIS WITH CONTRAST 04/18/2024 12:18:16 PM TECHNIQUE: CT of the abdomen and pelvis was performed with the administration of 100 mL of iohexol (OMNIPAQUE) 300 MG/ML solution. Multiplanar reformatted images are provided for review. Automated exposure control, iterative reconstruction, and/or weight-based adjustment of the mA/kV was utilized to reduce the radiation dose to as low as reasonably achievable. COMPARISON: None available. CLINICAL HISTORY: L sided abd pain, new AKI, n/v FINDINGS: LOWER CHEST: No acute abnormality. LIVER: The liver is unremarkable. GALLBLADDER AND BILE DUCTS: Gallbladder is unremarkable. No biliary ductal dilatation. SPLEEN: No acute abnormality. PANCREAS: No acute abnormality. ADRENAL GLANDS: No acute abnormality. KIDNEYS, URETERS AND BLADDER: Left nephrolithiasis is noted. Moderate left hydroureteronephrosis is noted with perinephric stranding and ureteral wall thickening secondary to multiple distal left ureteral calculi, the largest measuring 6 mm. Findings are concerning for associated pyelonephritis. No stones in the right kidney or ureter. No right hydronephrosis. No right perinephric or periureteral stranding. Urinary bladder is unremarkable. GI AND BOWEL: Stomach demonstrates no acute abnormality. There is no bowel obstruction. PERITONEUM AND RETROPERITONEUM: No ascites. No free air. VASCULATURE: Aorta is normal in caliber. LYMPH NODES: No lymphadenopathy. REPRODUCTIVE ORGANS: No acute abnormality. BONES AND SOFT TISSUES: No acute osseous abnormality. No focal soft tissue abnormality. IMPRESSION: 1. Left nephrolithiasis with moderate left hydroureteronephrosis due to multiple distal left ureteral calculi, largest 6 mm. 2. Findings suspicious for acute pyelonephritis. Electronically signed by: Lynwood Seip MD  04/18/2024 01:03 PM EST RP Workstation: [id number]   Assessment & Plan: 48 y.o. male with now s/p left ureteral stent placement with Dr. Francisca for management of a 6 mm obstructing distal left ureteral stone with pyelonephritis.  He is clinically improving on empiric antibiotics.  He is tolerating his stent well with no reported discomfort.  We discussed that he will require ~10 days of culture-appropriate antibiotics to treat his infection, followed by outpatient left ureteroscopy with laser lithotripsy and stent exchange in 3-4 weeks to treat his stone. He expressed understanding.  Recommendations: -Continue empiric antibiotics and follow cultures for a total of ~10 days of culture-appropriate therapy. -Discontinue Foley catheter once afebrile x24 hours, anticipate this afternoon around 1600. -  Consider Flomax 0.4mg  daily and oxybutynin 5mg  every 8 hours as needed if he develops stent discomfort. -Outpatient left URS/LL/stent exchange with Dr. Francisca in 3-4 weeks; our scheduler will contact him after discharge to arrange  Lucie Hones, PA-C 04/18/2024

## 2024-04-18 NOTE — Op Note (Signed)
 Date of procedure: 04/18/24  Preoperative diagnosis:  Left ureteral stone Urinary tract infection Acute kidney injury  Postoperative diagnosis:  Same  Procedure: Cystoscopy, left ureteral stent placement  Surgeon: Redell Burnet, MD  Anesthesia: General  Complications: None  Intraoperative findings:  Normal bladder, no suspicious lesions, small prostate Uncomplicated left ureteral stent placement  EBL: Minimal  Specimens: Left renal aspirate for culture  Drains: Left 6 French by 28 cm ureteral stent, 18 French Foley  Indication: Jordan Dunn is a 48 y.o. patient with abdominal pain and fever, CT showing 6 mm left distal ureteral stone with hydronephrosis and clinical picture concerning for pyelonephritis and opted for stent placement.  After reviewing the management options for treatment, they elected to proceed with the above surgical procedure(s). We have discussed the potential benefits and risks of the procedure, side effects of the proposed treatment, the likelihood of the patient achieving the goals of the procedure, and any potential problems that might occur during the procedure or recuperation. Informed consent has been obtained.  Description of procedure:  The patient was taken to the operating room and general anesthesia was induced. SCDs were placed for DVT prophylaxis. The patient was placed in the dorsal lithotomy position, prepped and draped in the usual sterile fashion, and preoperative antibiotics(Zosyn in ER) were administered. A preoperative time-out was performed.   A 21 French rigid cystoscope was used to intubate the urethra and a normal-appearing urethra was followed proximally to the bladder.  The prostate was small.  Thorough cystoscopy showed no suspicious lesions, the ureteral orifices were orthotopic bilaterally.  On fluoroscopy there was retained contrast from prior CT showing moderate to severe left hydronephrosis.  With the aid of a ureteral access  catheter, a sensor wire was advanced alongside the stone up into the left kidney under fluoroscopic vision.  The access catheter was advanced over the wire and the wire removed, cloudy urine was sent for culture from the left kidney.  The sensor wire was replaced, and a 6 French by 28 cm ureteral stent was placed uneventfully with a curl in the upper pole, as well as under direct vision in the bladder.  Cloudy urine drained through the side ports of the stent.  An 32 French Foley was placed with return of cloudy urine, and 10 mL were placed in the balloon.  Catheter was connected to drainage.  Disposition: Stable to PACU  Plan: Foley can be removed when clinically improving, typically afebrile for at least 24 hours Continue antibiotics and hospitalist admission Will need outpatient ureteroscopy, laser lithotripsy, stent change in 3 to 4 weeks after infection treated  Redell Burnet, MD

## 2024-04-18 NOTE — Plan of Care (Signed)

## 2024-04-18 NOTE — Consult Note (Signed)
 CODE SEPSIS - PHARMACY COMMUNICATION  **Broad Spectrum Antibiotics should be administered within 1 hour of Sepsis diagnosis**  Time Code Sepsis Called/Page Received: 1412  Antibiotics Ordered: Zosyn 3.375G x1, Rocephin 2G q24h  Time of 1st antibiotic administration: 1331  Additional action taken by pharmacy: none  If necessary, Name of Provider/Nurse Contacted: n/a    Annabella LOISE Banks ,PharmD Clinical Pharmacist  04/18/2024  2:14 PM

## 2024-04-18 NOTE — Anesthesia Procedure Notes (Signed)
 Procedure Name: LMA Insertion Date/Time: 04/18/2024 4:55 PM  Performed by: Lacretia Camelia NOVAK, CRNAPre-anesthesia Checklist: Emergency Drugs available, Patient identified, Suction available and Patient being monitored Patient Re-evaluated:Patient Re-evaluated prior to induction Oxygen Delivery Method: Circle system utilized Preoxygenation: Pre-oxygenation with 100% oxygen Induction Type: IV induction LMA: LMA inserted LMA Size: 4.0 Number of attempts: 1 Placement Confirmation: positive ETCO2 Tube secured with: Tape Dental Injury: Teeth and Oropharynx as per pre-operative assessment

## 2024-04-18 NOTE — Transfer of Care (Signed)
 Immediate Anesthesia Transfer of Care Note  Patient: Jordan Dunn  Procedure(s) Performed: CYSTOSCOPY, WITH RETROGRADE PYELOGRAM AND URETERAL STENT INSERTION (Left)  Patient Location: PACU  Anesthesia Type:General  Level of Consciousness: drowsy and patient cooperative  Airway & Oxygen Therapy: Patient Spontanous Breathing and Patient connected to face mask oxygen  Post-op Assessment: Report given to RN and Post -op Vital signs reviewed and stable  Post vital signs: Reviewed and stable  Last Vitals:  Vitals Value Taken Time  BP 122/108 04/18/24 17:20  Temp    Pulse 93 04/18/24 17:24  Resp 22 04/18/24 17:24  SpO2 94 % 04/18/24 17:24  Vitals shown include unfiled device data.  Last Pain:  Vitals:   04/18/24 1547  TempSrc: Temporal  PainSc: 0-No pain         Complications: No notable events documented.

## 2024-04-18 NOTE — Anesthesia Preprocedure Evaluation (Signed)
 Anesthesia Evaluation  Patient identified by MRN, date of birth, ID band Patient awake    Reviewed: Allergy & Precautions, H&P , NPO status , Patient's Chart, lab work & pertinent test results, reviewed documented beta blocker date and time   Airway Mallampati: II  TM Distance: >3 FB Neck ROM: full    Dental  (+) Teeth Intact   Pulmonary neg pulmonary ROS   Pulmonary exam normal        Cardiovascular Exercise Tolerance: Good hypertension, On Medications negative cardio ROS Normal cardiovascular exam Rhythm:regular Rate:Normal     Neuro/Psych negative neurological ROS  negative psych ROS   GI/Hepatic negative GI ROS, Neg liver ROS,,,  Endo/Other  negative endocrine ROS    Renal/GU ARFRenal disease  negative genitourinary   Musculoskeletal   Abdominal   Peds  Hematology negative hematology ROS (+)   Anesthesia Other Findings Past Medical History: No date: Hypertension History reviewed. No pertinent surgical history. BMI    Body Mass Index: 21.18 kg/m     Reproductive/Obstetrics negative OB ROS                              Anesthesia Physical Anesthesia Plan  ASA: 2 and emergent  Anesthesia Plan: General ETT   Post-op Pain Management:    Induction:   PONV Risk Score and Plan: 3  Airway Management Planned:   Additional Equipment:   Intra-op Plan:   Post-operative Plan:   Informed Consent: I have reviewed the patients History and Physical, chart, labs and discussed the procedure including the risks, benefits and alternatives for the proposed anesthesia with the patient or authorized representative who has indicated his/her understanding and acceptance.     Dental Advisory Given  Plan Discussed with: CRNA  Anesthesia Plan Comments:         Anesthesia Quick Evaluation

## 2024-04-18 NOTE — Progress Notes (Signed)
 Notified Dr. Myra of elevated BP. Per Dr. Myra administer labetalol 10 mg.

## 2024-04-18 NOTE — H&P (Addendum)
 History and Physical    Jordan Dunn FMW:[id number] DOB: Jan 10, 1976 DOA: 04/18/2024  PCP: Pcp, No (Confirm with patient/family/NH records and if not entered, this has to be entered at [facility] point of entry) Patient coming from: Home  I have personally briefly reviewed patient's old medical records in [facility]  Chief Complaint: Belly hurts, fever, nauseous vomiting  HPI: Jordan Dunn is a 48 y.o. male with medical history significant of HTN, presented with worsening of left lower quadrant abdominal pain, nauseous vomiting dysuria.  Symptoms started 5 days ago, patient started to develop cramping-like left lower quadrant abdominal pain and left flank pain, which he attributed to chronic constipation.  Last few days he started to experience worsening of the pain, and since last night he started to have repeated nauseous vomiting and episode of subjective fever and chills and dysuria.  No history of kidney stone.  ED Course: Temperature 100.1 borderline tachycardia blood pressure 190/100 O2 saturation 100% room air.  CT abdomen pelvis showed left nephrolithiasis with moderate left hydroureteronephrosis due to multiple distal left ureteral calculi largest 6 mm, stranding of the left kidney suggesting acute pyelonephritis.  UA showed 1+ WBC and 1+ RBC.  Blood work showed WBC 22.3, hemoglobin 13, BUN 20 creatinine 1.8.  Lactic acid 0.9> 0.8.  Patient was given IV bolus and ceftriaxone and Flagyl in the ED.    Review of Systems: As per HPI otherwise 14 point review of systems negative.    Past Medical History:  Diagnosis Date   Hypertension     History reviewed. No pertinent surgical history.   reports that he has an unknown smoking status. He has never used smokeless tobacco. He reports that he does not currently use alcohol. He reports that he does not use drugs.  No Known Allergies  History reviewed. No pertinent family history.   Prior to Admission medications    Medication Sig Start Date End Date Taking? Authorizing Provider  amLODipine  (NORVASC ) 5 MG tablet Take 1 tablet (5 mg total) by mouth daily. 05/03/23 05/02/24 Yes Dorothyann Drivers, MD    Physical Exam: Vitals:   04/18/24 1230 04/18/24 1231 04/18/24 1300 04/18/24 1400  BP: (!) 196/116   (!) 195/112  Pulse: (!) 103 99  97  Resp: (!) 24 18  (!) 24  Temp:   100.1 F (37.8 C)   TempSrc:   Oral   SpO2: 100% 96%  91%    Constitutional: NAD, calm, comfortable Vitals:   04/18/24 1230 04/18/24 1231 04/18/24 1300 04/18/24 1400  BP: (!) 196/116   (!) 195/112  Pulse: (!) 103 99  97  Resp: (!) 24 18  (!) 24  Temp:   100.1 F (37.8 C)   TempSrc:   Oral   SpO2: 100% 96%  91%   Eyes: PERRL, lids and conjunctivae normal ENMT: Mucous membranes are moist. Posterior pharynx clear of any exudate or lesions.Normal dentition.  Neck: normal, supple, no masses, no thyromegaly Respiratory: clear to auscultation bilaterally, no wheezing, no crackles. Normal respiratory effort. No accessory muscle use.  Cardiovascular: Regular rate and rhythm, no murmurs / rubs / gallops. No extremity edema. 2+ pedal pulses. No carotid bruits.  Abdomen: Left CVA tenderness, no masses palpated. No hepatosplenomegaly. Bowel sounds positive.  Musculoskeletal: no clubbing / cyanosis. No joint deformity upper and lower extremities. Good ROM, no contractures. Normal muscle tone.  Skin: no rashes, lesions, ulcers. No induration Neurologic: CN 2-12 grossly intact. Sensation intact, DTR normal. Strength 5/5 in all  4.  Psychiatric: Normal judgment and insight. Alert and oriented x 3. Normal mood.     Labs on Admission: I have personally reviewed following labs and imaging studies  CBC: Recent Labs  Lab 04/18/24 0916  WBC 22.3*  HGB 13.2  HCT 39.1  MCV 89.3  PLT 289   Basic Metabolic Panel: Recent Labs  Lab 04/18/24 0916  NA 135  K 4.1  CL 98  CO2 26  GLUCOSE 119*  BUN 25*  CREATININE 1.85*  CALCIUM 9.2    GFR: CrCl cannot be calculated (Unknown ideal weight.). Liver Function Tests: Recent Labs  Lab 04/18/24 0916  AST 21  ALT 26  ALKPHOS 124  BILITOT 0.6  PROT 8.1  ALBUMIN 3.9   Recent Labs  Lab 04/18/24 0916  LIPASE 21   No results for input(s): AMMONIA in the last 168 hours. Coagulation Profile: No results for input(s): INR, PROTIME in the last 168 hours. Cardiac Enzymes: No results for input(s): CKTOTAL, CKMB, CKMBINDEX, TROPONINI in the last 168 hours. BNP (last 3 results) No results for input(s): PROBNP in the last 8760 hours. HbA1C: No results for input(s): HGBA1C in the last 72 hours. CBG: No results for input(s): GLUCAP in the last 168 hours. Lipid Profile: No results for input(s): CHOL, HDL, LDLCALC, TRIG, CHOLHDL, LDLDIRECT in the last 72 hours. Thyroid Function Tests: No results for input(s): TSH, T4TOTAL, FREET4, T3FREE, THYROIDAB in the last 72 hours. Anemia Panel: No results for input(s): VITAMINB12, FOLATE, FERRITIN, TIBC, IRON, RETICCTPCT in the last 72 hours. Urine analysis:    Component Value Date/Time   COLORURINE YELLOW (A) 04/18/2024 1256   APPEARANCEUR CLEAR (A) 04/18/2024 1256   LABSPEC 1.033 (H) 04/18/2024 1256   PHURINE 5.0 04/18/2024 1256   GLUCOSEU NEGATIVE 04/18/2024 1256   HGBUR NEGATIVE 04/18/2024 1256   BILIRUBINUR NEGATIVE 04/18/2024 1256   KETONESUR NEGATIVE 04/18/2024 1256   PROTEINUR 30 (A) 04/18/2024 1256   NITRITE NEGATIVE 04/18/2024 1256   LEUKOCYTESUR NEGATIVE 04/18/2024 1256    Radiological Exams on Admission: CT ABDOMEN PELVIS W CONTRAST Result Date: 04/18/2024 EXAM: CT ABDOMEN AND PELVIS WITH CONTRAST 04/18/2024 12:18:16 PM TECHNIQUE: CT of the abdomen and pelvis was performed with the administration of 100 mL of iohexol (OMNIPAQUE) 300 MG/ML solution. Multiplanar reformatted images are provided for review. Automated exposure control, iterative reconstruction, and/or  weight-based adjustment of the mA/kV was utilized to reduce the radiation dose to as low as reasonably achievable. COMPARISON: None available. CLINICAL HISTORY: L sided abd pain, new AKI, n/v FINDINGS: LOWER CHEST: No acute abnormality. LIVER: The liver is unremarkable. GALLBLADDER AND BILE DUCTS: Gallbladder is unremarkable. No biliary ductal dilatation. SPLEEN: No acute abnormality. PANCREAS: No acute abnormality. ADRENAL GLANDS: No acute abnormality. KIDNEYS, URETERS AND BLADDER: Left nephrolithiasis is noted. Moderate left hydroureteronephrosis is noted with perinephric stranding and ureteral wall thickening secondary to multiple distal left ureteral calculi, the largest measuring 6 mm. Findings are concerning for associated pyelonephritis. No stones in the right kidney or ureter. No right hydronephrosis. No right perinephric or periureteral stranding. Urinary bladder is unremarkable. GI AND BOWEL: Stomach demonstrates no acute abnormality. There is no bowel obstruction. PERITONEUM AND RETROPERITONEUM: No ascites. No free air. VASCULATURE: Aorta is normal in caliber. LYMPH NODES: No lymphadenopathy. REPRODUCTIVE ORGANS: No acute abnormality. BONES AND SOFT TISSUES: No acute osseous abnormality. No focal soft tissue abnormality. IMPRESSION: 1. Left nephrolithiasis with moderate left hydroureteronephrosis due to multiple distal left ureteral calculi, largest 6 mm. 2. Findings suspicious for acute pyelonephritis. Electronically  signed by: Lynwood Seip MD 04/18/2024 01:03 PM EST RP Workstation: [id number]    EKG: Independently reviewed.  Sinus tachycardia, no acute ST changes.  Assessment/Plan Principal Problem:   Sepsis (HCC) Active Problems:   Obstructive uropathy   Acute pyelonephritis   Left ureteral stone  (please populate well all problems here in Problem List. (For example, if patient is on BP meds at home and you resume or decide to hold them, it is a problem that needs to be her. Same for CAD,  COPD, HLD and so on)  Sepsis, without acute endorgan damage Acute left-sided pyelonephritis -Improving - Sepsis as evidenced by tachycardia, leukocytosis, source of infection is left-sided pyelonephritis - Continue IV bolus and maintenance IV fluid - Continue ceftriaxone. - UA less remarkable for infection, empirically covered with ceftriaxone, and expect urology will collect upstream urine for culture.  Left-sided obstructive uropathy. Multiple distal left-sided obstructive ureteral stone. - OR this afternoon for cystoscopy and left ureteral stenting. - IV Dilaudid for pain control.  AKI - Likely postrenal secondary to left-sided obstructive uropathy, management as above, - Continue aggressive hydration.  HTN, uncontrolled - Secondary to poorly controlled left flank pain, - As needed albuterol, - Pain control as above.  DVT prophylaxis: SCD Code Status: Full code Family Communication: None at bedside Disposition Plan: Expect less than 2 midnight hospital stay Consults called: Urology Admission status: Telemetry observation   Cort ONEIDA Mana MD [facility] Pager [phone]  04/18/2024, 2:56 PM

## 2024-04-18 NOTE — ED Triage Notes (Signed)
 Arrived by [facility] From  home for RLQ pain. Last BM X5 days. Non compliant with meds per EMS. Reports N/V for three days but has subsided the last two days.   EMS vitals: 183/122 b/p 97% RA 126CBG 98.6oral 99HR

## 2024-04-19 ENCOUNTER — Encounter: Payer: Self-pay | Admitting: Urology

## 2024-04-19 ENCOUNTER — Other Ambulatory Visit: Payer: Self-pay | Admitting: Physician Assistant

## 2024-04-19 DIAGNOSIS — N201 Calculus of ureter: Secondary | ICD-10-CM

## 2024-04-19 LAB — URINE CULTURE: Culture: NO GROWTH

## 2024-04-19 LAB — BASIC METABOLIC PANEL WITH GFR
Anion gap: 10 (ref 5–15)
BUN: 30 mg/dL — ABNORMAL HIGH (ref 6–20)
CO2: 27 mmol/L (ref 22–32)
Calcium: 8.9 mg/dL (ref 8.9–10.3)
Chloride: 100 mmol/L (ref 98–111)
Creatinine, Ser: 1.37 mg/dL — ABNORMAL HIGH (ref 0.61–1.24)
GFR, Estimated: >60 mL/min (ref >60)
Glucose, Bld: 128 mg/dL — ABNORMAL HIGH (ref 70–99)
Potassium: 4.7 mmol/L (ref 3.5–5.1)
Sodium: 137 mmol/L (ref 135–145)

## 2024-04-19 LAB — MAGNESIUM: Magnesium: 2.8 mg/dL — ABNORMAL HIGH (ref 1.7–2.4)

## 2024-04-19 LAB — CBC
HCT: 35.1 % — ABNORMAL LOW (ref 39.0–52.0)
Hemoglobin: 12 g/dL — ABNORMAL LOW (ref 13.0–17.0)
MCH: 30.3 pg (ref 26.0–34.0)
MCHC: 34.2 g/dL (ref 30.0–36.0)
MCV: 88.6 fL (ref 80.0–100.0)
Platelets: 311 K/uL (ref 150–400)
RBC: 3.96 MIL/uL — ABNORMAL LOW (ref 4.22–5.81)
RDW: 14.1 % (ref 11.5–15.5)
WBC: 18.2 K/uL — ABNORMAL HIGH (ref 4.0–10.5)
nRBC: 0 % (ref 0.0–0.2)

## 2024-04-19 LAB — PHOSPHORUS: Phosphorus: 3.5 mg/dL (ref 2.5–4.6)

## 2024-04-19 MED ORDER — CARVEDILOL 6.25 MG PO TABS
6.2500 mg | ORAL_TABLET | Freq: Two times a day (BID) | ORAL | Status: DC
  Administered 2024-04-19 – 2024-04-20 (×2): 6.25 mg via ORAL
  Filled 2024-04-19 (×2): qty 1

## 2024-04-19 MED ORDER — CHLORHEXIDINE GLUCONATE CLOTH 2 % EX PADS
6.0000 | MEDICATED_PAD | Freq: Every day | CUTANEOUS | Status: DC
  Administered 2024-04-19 – 2024-04-20 (×2): 6 via TOPICAL

## 2024-04-19 MED ORDER — HYDRALAZINE HCL 50 MG PO TABS
50.0000 mg | ORAL_TABLET | Freq: Four times a day (QID) | ORAL | Status: DC | PRN
  Administered 2024-04-19 – 2024-04-20 (×2): 50 mg via ORAL
  Filled 2024-04-19 (×2): qty 1

## 2024-04-19 MED ORDER — ENOXAPARIN SODIUM 40 MG/0.4ML IJ SOSY
40.0000 mg | PREFILLED_SYRINGE | INTRAMUSCULAR | Status: DC
  Administered 2024-04-19: 40 mg via SUBCUTANEOUS
  Filled 2024-04-19: qty 0.4

## 2024-04-19 MED ORDER — AMLODIPINE BESYLATE 10 MG PO TABS
10.0000 mg | ORAL_TABLET | Freq: Every day | ORAL | Status: DC
  Administered 2024-04-19 – 2024-04-20 (×2): 10 mg via ORAL
  Filled 2024-04-19 (×2): qty 1

## 2024-04-19 NOTE — TOC Initial Note (Signed)
 Transition of Care [facility]) - Initial/Assessment Note    Patient Details  Name: Jordan Dunn MRN: [id number] Date of Birth: [birth year]  Transition of Care [facility]) CM/SW Contact:    Nathanael CHRISTELLA Ring, RN Phone Number: 04/19/2024, 11:00 AM  Clinical Narrative:                 CM met with patient at the bedside, alert, oriented, pleasant and receptive.  He is from home where he lives with his fiance and uncle.  He is independent in ADL's, he works at [employer] on [address] in [place].  He does not drive, he reports that his fiance does but does not have a working vehicle at this time.  He will have transportation at discharge he will call an [place], he will do the same for his follow up appointments.  He does not have a PCP, he should be taking BP medication but since he does not have a doctor he has no one to prescribe it for him.  Reviewed community resources with him, [facility] and the [employer] for PCP  follow up, he is not sure which he would like to use he wants to speak with his fiance about it.  Application for [facility] given to him, instructed that he needs to fill this out and return, [facility] is free, the [employer] will have a sliding scale fee.  His medications from his hospital stay will be delivered to him at the bedside - Meds to bed.  He verbalizes understanding and has no questions at this time.  Expected Discharge Plan: Home/Self Care Barriers to Discharge: Continued Medical Work up   Patient Goals and CMS Choice Patient states their goals for this hospitalization and ongoing recovery are:: Pain to resolve and it has          Expected Discharge Plan and Services   Discharge Planning Services: CM Consult, Indigent Health Clinic, Medication Assistance, Follow-up appt scheduled (surgery coordinator will call pt at home to schedule surgery f/u)   Living arrangements for the past 2 months: Apartment                 DME Arranged: N/A                     Prior Living Arrangements/Services Living arrangements for the past 2 months: Apartment Lives with:: Media Planner, Relatives (fiance and uncle) Patient language and need for interpreter reviewed:: Yes Do you feel safe going back to the place where you live?: Yes      Need for Family Participation in Patient Care: No (Comment) Care giver support system in place?: Yes (comment)   Criminal Activity/Legal Involvement Pertinent to Current Situation/Hospitalization: No - Comment as needed  Activities of Daily Living   ADL Screening (condition at time of admission) Independently performs ADLs?: Yes (appropriate for developmental age) Is the patient deaf or have difficulty hearing?: No Does the patient have difficulty seeing, even when wearing glasses/contacts?: No Does the patient have difficulty concentrating, remembering, or making decisions?: No  Permission Sought/Granted   Permission granted to share information with : No              Emotional Assessment Appearance:: Appears stated age, Well-Groomed Attitude/Demeanor/Rapport: Engaged Affect (typically observed): Accepting, Calm, Pleasant Orientation: : Oriented to Self, Oriented to Place, Oriented to  Time, Oriented to Situation Alcohol / Substance Use: Not Applicable Psych Involvement: No (comment)  Admission diagnosis:  Nephrolithiasis [  N20.0] Pyelonephritis [N12] Sepsis (HCC) [A41.9] Sepsis, due to unspecified organism, unspecified whether acute organ dysfunction present [facility]) [A41.9] Patient Active Problem List   Diagnosis Date Noted   Obstructive uropathy 04/18/2024   Acute pyelonephritis 04/18/2024   Sepsis (HCC) 04/18/2024   Left ureteral stone 04/18/2024   PCP:  Pcp, No Pharmacy:   [facility] [address] ([address]), [place] - [address] [place]) [place] [address] Phone: [phone] Fax: [phone]  [facility] [address] [place] [address] Phone: [phone] Fax: [phone]     Social Drivers of Health (SDOH) Social History: SDOH Screenings   Food Insecurity: No Food Insecurity (04/18/2024)  Housing: Low Risk  (04/18/2024)  Transportation Needs: No Transportation Needs (04/18/2024)  Utilities: Not At Risk (04/18/2024)  Tobacco Use: Unknown (04/18/2024)   SDOH Interventions:     Readmission Risk Interventions     No data to display

## 2024-04-19 NOTE — Plan of Care (Signed)
 Patient continues with elevated blood pressure this shift.  Problem: Clinical Measurements: Goal: Ability to maintain clinical measurements within normal limits will improve Outcome: Not Progressing Goal: Will remain free from infection Outcome: Progressing   Problem: Activity: Goal: Risk for activity intolerance will decrease Outcome: Progressing   Problem: Nutrition: Goal: Adequate nutrition will be maintained Outcome: Progressing   Problem: Elimination: Goal: Will not experience complications related to bowel motility Outcome: Progressing Goal: Will not experience complications related to urinary retention Outcome: Progressing   Problem: Pain Managment: Goal: General experience of comfort will improve and/or be controlled Outcome: Progressing

## 2024-04-19 NOTE — Progress Notes (Signed)
 [facility] Progress Note  Patient: Jordan Dunn    FMW:[id number]  DOA: 04/18/2024     Date of Service: the patient was seen and examined on 04/19/2024  Chief Complaint  Patient presents with   Abdominal Pain   Brief hospital course: FERRIS FIELDEN is a 48 y.o. male with medical history significant of HTN, presented with worsening of left lower quadrant abdominal pain, nauseous vomiting dysuria.   Symptoms started 5 days ago, patient started to develop cramping-like left lower quadrant abdominal pain and left flank pain, which he attributed to chronic constipation.  Last few days he started to experience worsening of the pain, and since last night he started to have repeated nauseous vomiting and episode of subjective fever and chills and dysuria.  No history of kidney stone.   ED Course: Temperature 100.1 borderline tachycardia blood pressure 190/100 O2 saturation 100% room air.  CT abdomen pelvis showed left nephrolithiasis with moderate left hydroureteronephrosis due to multiple distal left ureteral calculi largest 6 mm, stranding of the left kidney suggesting acute pyelonephritis.  UA showed 1+ WBC and 1+ RBC.  Blood work showed WBC 22.3, hemoglobin 13, BUN 20 creatinine 1.8.  Lactic acid 0.9> 0.8.   Patient was given IV bolus and ceftriaxone and Flagyl in the ED.     Assessment and Plan:  # Sepsis due to Acute left-sided pyelonephritis - Sepsis as evidenced by tachycardia, leukocytosis, source of infection is left-sided pyelonephritis -s/p IV bolus and maintenance IV fluid - Continue ceftriaxone. Blood culture NGTD Follow urine culture WBC 22.3>> 18.2 Trend WBC count   # Left-sided obstructive uropathy. Multiple distal left-sided obstructive ureteral stone. - OR this afternoon for cystoscopy and left ureteral stenting. - IV Dilaudid for pain control.   # AKI - Likely postrenal secondary to left-sided obstructive uropathy, management as above, - Continue  aggressive hydration. sCr 1.85>>1.37   # HTN, uncontrolled - Secondary to poorly controlled left flank pain, - Pain control as above. Started amlodipine  10 mg p.o. daily and Coreg 6.25 mg p.o. twice daily Hydralazine 50 mg p.o. Q6 hourly as needed Labetalol IV as needed Monitor BP and titrate medication accordingly   Body mass index is 21.18 kg/m.  Interventions:  Diet: Heart healthy diet DVT Prophylaxis: Subcutaneous Lovenox   Advance goals of care discussion: Full code  Family Communication: family was not present at bedside, at the time of interview.  The pt provided permission to discuss medical plan with the family. Opportunity was given to ask question and all questions were answered satisfactorily.   Disposition:  Pt is from home, admitted with sepsis, left pyelonephritis, status post left ureteral stent insertion, still on IV antibiotics, urine culture pending, leukocytosis, uncontrolled blood pressure, which precludes a safe discharge. Discharge to home, when stable, may need few days to improve.  Subjective: No significant events overnight, pain is well-controlled.  Patient denies any chest pain or palpitation, no abdominal pain, no shortness of breath.  Physical Exam: General: NAD, lying comfortably Appear in no distress, affect appropriate Eyes: PERRLA ENT: Oral Mucosa Clear, moist  Neck: no JVD,  Cardiovascular: S1 and S2 Present, no Murmur,  Respiratory: good respiratory effort, Bilateral Air entry equal and Decreased, no Crackles, no wheezes Abdomen: BS present, Soft and mild left flank tenderness,  Skin: no rashes Extremities: no Pedal edema, no calf tenderness Neurologic: without any new focal findings Gait not checked due to patient safety concerns  Vitals:   04/18/24 2033 04/19/24 0353 04/19/24 0451 04/19/24 0845  BP: (!) 167/101 (!) 170/111 (!) 158/99 (!) 164/108  Pulse: 91 97  83  Resp: 20 20    Temp: 99.1 F (37.3 C) 98.3 F (36.8 C)  98.4 F  (36.9 C)  TempSrc: Oral   Oral  SpO2: 97% 99%  99%  Weight:      Height:        Intake/Output Summary (Last 24 hours) at 04/19/2024 1305 Last data filed at 04/19/2024 9357 Gross per 24 hour  Intake 500 ml  Output 1050 ml  Net -550 ml   Filed Weights   04/18/24 1547  Weight: 74.8 kg    Data Reviewed: I have personally reviewed and interpreted daily labs, tele strips, imagings as discussed above. I reviewed all nursing notes, pharmacy notes, vitals, pertinent old records I have discussed plan of care as described above with RN and patient/family.  CBC: Recent Labs  Lab 04/18/24 0916 04/19/24 0855  WBC 22.3* 18.2*  HGB 13.2 12.0*  HCT 39.1 35.1*  MCV 89.3 88.6  PLT 289 311   Basic Metabolic Panel: Recent Labs  Lab 04/18/24 0916 04/19/24 0855  NA 135 137  K 4.1 4.7  CL 98 100  CO2 26 27  GLUCOSE 119* 128*  BUN 25* 30*  CREATININE 1.85* 1.37*  CALCIUM 9.2 8.9  MG  --  2.8*  PHOS  --  3.5    Studies: DG OR UROLOGY CYSTO IMAGE ([facility] ONLY) Result Date: 04/18/2024 There is no interpretation for this exam.  This order is for images obtained during a surgical procedure.  Please See Surgeries Tab for more information regarding the procedure.    Scheduled Meds:  amLODipine   10 mg Oral Daily   Chlorhexidine Gluconate Cloth  6 each Topical Daily   influenza vac split trivalent PF  0.5 mL Intramuscular Tomorrow-1000   Continuous Infusions:  cefTRIAXone (ROCEPHIN)  IV Stopped (04/18/24 2158)   PRN Meds: acetaminophen **OR** acetaminophen, bisacodyl, hydrALAZINE, HYDROmorphone (DILAUDID) injection, labetalol, ondansetron **OR** ondansetron (ZOFRAN) IV  Time spent: 55 minutes  Author: ELVAN SOR. MD [facility] 04/19/2024 1:05 PM  To reach On-call, see care teams to locate the attending and reach out to them via [email or web address]. If 7PM-7AM, please contact night-coverage If you still have difficulty reaching the attending provider, please page the [facility]  (Director on Call) for [facility] on amion for assistance.

## 2024-04-20 ENCOUNTER — Other Ambulatory Visit: Payer: Self-pay

## 2024-04-20 LAB — BASIC METABOLIC PANEL WITH GFR
Anion gap: 10 (ref 5–15)
BUN: 32 mg/dL — ABNORMAL HIGH (ref 6–20)
CO2: 26 mmol/L (ref 22–32)
Calcium: 8.9 mg/dL (ref 8.9–10.3)
Chloride: 105 mmol/L (ref 98–111)
Creatinine, Ser: 1.41 mg/dL — ABNORMAL HIGH (ref 0.61–1.24)
GFR, Estimated: >60 mL/min (ref >60)
Glucose, Bld: 112 mg/dL — ABNORMAL HIGH (ref 70–99)
Potassium: 4.3 mmol/L (ref 3.5–5.1)
Sodium: 141 mmol/L (ref 135–145)

## 2024-04-20 LAB — CBC
HCT: 37.6 % — ABNORMAL LOW (ref 39.0–52.0)
Hemoglobin: 12.6 g/dL — ABNORMAL LOW (ref 13.0–17.0)
MCH: 30.5 pg (ref 26.0–34.0)
MCHC: 33.5 g/dL (ref 30.0–36.0)
MCV: 91 fL (ref 80.0–100.0)
Platelets: 356 K/uL (ref 150–400)
RBC: 4.13 MIL/uL — ABNORMAL LOW (ref 4.22–5.81)
RDW: 14.6 % (ref 11.5–15.5)
WBC: 15.6 K/uL — ABNORMAL HIGH (ref 4.0–10.5)
nRBC: 0 % (ref 0.0–0.2)

## 2024-04-20 LAB — PHOSPHORUS: Phosphorus: 3.1 mg/dL (ref 2.5–4.6)

## 2024-04-20 LAB — MAGNESIUM: Magnesium: 2.8 mg/dL — ABNORMAL HIGH (ref 1.7–2.4)

## 2024-04-20 MED ORDER — CARVEDILOL 6.25 MG PO TABS
6.2500 mg | ORAL_TABLET | Freq: Two times a day (BID) | ORAL | 11 refills | Status: AC
  Filled 2024-04-20: qty 60, 30d supply, fill #0

## 2024-04-20 MED ORDER — SULFAMETHOXAZOLE-TRIMETHOPRIM 800-160 MG PO TABS
1.0000 | ORAL_TABLET | Freq: Two times a day (BID) | ORAL | 0 refills | Status: DC
  Filled 2024-04-20: qty 14, 7d supply, fill #0

## 2024-04-20 MED ORDER — AMLODIPINE BESYLATE 10 MG PO TABS
10.0000 mg | ORAL_TABLET | Freq: Every day | ORAL | 5 refills | Status: AC
  Filled 2024-04-20: qty 30, 30d supply, fill #0

## 2024-04-20 MED ORDER — HYDRALAZINE HCL 50 MG PO TABS
50.0000 mg | ORAL_TABLET | Freq: Three times a day (TID) | ORAL | 1 refills | Status: AC | PRN
  Filled 2024-04-20: qty 30, 10d supply, fill #0

## 2024-04-20 MED ORDER — SULFAMETHOXAZOLE-TRIMETHOPRIM 800-160 MG PO TABS: 1.0000 | ORAL_TABLET | Freq: Two times a day (BID) | ORAL | Status: DC

## 2024-04-20 MED ORDER — SODIUM CHLORIDE 0.9 % IV SOLN
2.0000 g | INTRAVENOUS | Status: AC
  Administered 2024-04-20: 2 g via INTRAVENOUS
  Filled 2024-04-20: qty 20

## 2024-04-20 NOTE — Anesthesia Postprocedure Evaluation (Signed)
 Anesthesia Post Note  Patient: Jordan Dunn  Procedure(s) Performed: CYSTOSCOPY, WITH RETROGRADE PYELOGRAM AND URETERAL STENT INSERTION (Left)  Patient location during evaluation: PACU Anesthesia Type: General Level of consciousness: awake and alert Pain management: pain level controlled Vital Signs Assessment: post-procedure vital signs reviewed and stable Respiratory status: spontaneous breathing, nonlabored ventilation, respiratory function stable and patient connected to nasal cannula oxygen Cardiovascular status: blood pressure returned to baseline and stable Postop Assessment: no apparent nausea or vomiting Anesthetic complications: no   No notable events documented.   Last Vitals:  Vitals:   04/20/24 0348 04/20/24 0630  BP: (!) 162/104 (!) 157/108  Pulse: 88 77  Resp: 18   Temp: 36.9 C   SpO2: 98%     Last Pain:  Vitals:   04/20/24 0348  TempSrc: Oral  PainSc:                  Lynwood KANDICE Clause

## 2024-04-20 NOTE — Discharge Summary (Signed)
 [facility] Discharge Summary   Patient: Jordan Dunn FMW:[id number]  PCP: Pcp, No  Date of admission: 04/18/2024   Date of discharge:  04/20/2024     Discharge Diagnoses:  Principal Problem:   Sepsis [facility]) Active Problems:   Obstructive uropathy   Acute pyelonephritis   Left ureteral stone   Admitted From: Home Disposition:  Home   Recommendations for Outpatient Follow-up:  Follow-up with PCP in 1 week Follow-up with urology in 1 week Continue to monitor BP at home and follow with PCP to titrate medications accordingly. Follow up LABS/TEST:  Repeat CBC and BMP in 1 week   Follow-up Information     PCP Follow up in 1 week(s).          Francisca Redell BROCKS, MD Follow up in 1 week(s).   Specialty: Urology Contact information: [address] [place] [address] [phone]                Diet recommendation: Cardiac diet  Activity: The patient is advised to gradually reintroduce usual activities, as tolerated  Discharge Condition: stable  Code Status: Full code   History of present illness: As per the H and P dictated on admission.  Hospital Course:  Jordan Dunn is a 48 y.o. male with medical history significant of HTN, presented with worsening of left lower quadrant abdominal pain, nauseous vomiting dysuria.   Symptoms started 5 days ago, patient started to develop cramping-like left lower quadrant abdominal pain and left flank pain, which he attributed to chronic constipation.  Last few days he started to experience worsening of the pain, and since last night he started to have repeated nauseous vomiting and episode of subjective fever and chills and dysuria.  No history of kidney stone.   ED Course: Temperature 100.1 borderline tachycardia blood pressure 190/100 O2 saturation 100% room air.  CT abdomen pelvis showed left nephrolithiasis with moderate left hydroureteronephrosis due to multiple distal left ureteral calculi largest 6 mm,  stranding of the left kidney suggesting acute pyelonephritis.  UA showed 1+ WBC and 1+ RBC.  Blood work showed WBC 22.3, hemoglobin 13, BUN 20 creatinine 1.8.  Lactic acid 0.9> 0.8.   Patient was given IV bolus and ceftriaxone and Flagyl in the ED.       Assessment and Plan:   # Sepsis due to Acute left-sided pyelonephritis - Sepsis as evidenced by tachycardia, leukocytosis, source of infection is left-sided pyelonephritis. -s/p IV bolus and maintenance IV fluid. S/p ceftriaxone. Blood culture NGTD and urine culture no growth. WBC 22.3>> 18.2>> 15.6, gradually trending down.  Patient remained afebrile.  Mild hematuria noticed, discussed with urology, recommended decrease, and after stent insertion.  Foley was discontinued.  RN was advised to do bladder scan and make sure patient voids before discharge. Patient was transition to oral antibiotics Bactrim twice daily for 7 days to complete total 10-day course of antibiotics.  Recommended to follow with PCP and urology in 1 week.   # Left-sided obstructive uropathy. Multiple distal left-sided obstructive ureteral stone. - OR this afternoon for cystoscopy and left ureteral stenting. - s/p IV Dilaudid for pain control.  Currently patient is pain-free.  Cleared by urology to discharge and follow-up as an outpatient.   # AKI - Likely postrenal secondary to left-sided obstructive uropathy, management as above, s/p IVF, sCr 1.85>>1.37>1.41 with gradually improved, patient was advised to continue oral hydration and follow with PCP to repeat BMP after 1 week.   # HTN, uncontrolled Started  amlodipine  10 mg p.o. daily and Coreg 6.25 mg p.o. twice daily. Hydralazine 50 mg p.o. TID prn S/p Labetalol IV prn given during hospital stay.  Patient was advised to monitor BP at home and follow with PCP to titrate medication accordingly.  Body mass index is 21.18 kg/m.  Nutrition Interventions:   Patient was ambulatory without any assistance.  On the day  of the discharge the patient's vitals were stable, and no other acute medical condition were reported by patient. the patient was felt safe to be discharge at Home.  Consultants: Urology Procedures: s/p cystoscopy, left ureteral stent insertion.  Discharge Exam: General: Appear in no distress, Oral Mucosa Clear, moist. Cardiovascular: S1 and S2 Present, no Murmur, Respiratory: normal respiratory effort, Bilateral Air entry present and no Crackles, no wheezes Abdomen: Bowel Sound present, Soft and no tenderness. Extremities: no Pedal edema, no calf tenderness Neurology: alert and oriented to time, place, and person affect appropriate.  Filed Weights   04/18/24 1547  Weight: 74.8 kg   Vitals:   04/20/24 0730 04/20/24 1406  BP: (!) 160/103 (!) 158/94  Pulse: 80 91  Resp: 16   Temp: 98.3 F (36.8 C)   SpO2: 99%     DISCHARGE MEDICATION: Allergies as of 04/20/2024   No Known Allergies      Medication List     TAKE these medications    amLODipine  10 MG tablet Commonly known as: NORVASC  Take 1 tablet (10 mg total) by mouth daily. What changed:  medication strength how much to take   carvedilol 6.25 MG tablet Commonly known as: COREG Take 1 tablet (6.25 mg total) by mouth 2 (two) times daily with a meal.   hydrALAZINE 50 MG tablet Commonly known as: APRESOLINE Take 1 tablet (50 mg total) by mouth 3 (three) times daily as needed (Systolic BP greater than >150).   sulfamethoxazole-trimethoprim 800-160 MG tablet Commonly known as: BACTRIM DS Take 1 tablet by mouth every 12 (twelve) hours for 7 days. Start taking on: April 21, 2024       No Known Allergies Discharge Instructions     Call MD for:  difficulty breathing, headache or visual disturbances   Complete by: As directed    Call MD for:  extreme fatigue   Complete by: As directed    Call MD for:  persistant dizziness or light-headedness   Complete by: As directed    Call MD for:  persistant nausea  and vomiting   Complete by: As directed    Call MD for:  severe uncontrolled pain   Complete by: As directed    Call MD for:  temperature >100.4   Complete by: As directed    Diet - low sodium heart healthy   Complete by: As directed    Discharge instructions   Complete by: As directed    Follow-up with PCP in 1 week Follow-up with urology in 1 week Continue to monitor BP at home and follow with PCP to titrate medications accordingly. Repeat CBC and BMP in 1 week   Increase activity slowly   Complete by: As directed    No wound care   Complete by: As directed        The results of significant diagnostics from this hospitalization (including imaging, microbiology, ancillary and laboratory) are listed below for reference.    Significant Diagnostic Studies: DG OR UROLOGY CYSTO IMAGE ([facility] ONLY) Result Date: 04/18/2024 There is no interpretation for this exam.  This order is for images obtained  during a surgical procedure.  Please See Surgeries Tab for more information regarding the procedure.   CT ABDOMEN PELVIS W CONTRAST Result Date: 04/18/2024 EXAM: CT ABDOMEN AND PELVIS WITH CONTRAST 04/18/2024 12:18:16 PM TECHNIQUE: CT of the abdomen and pelvis was performed with the administration of 100 mL of iohexol (OMNIPAQUE) 300 MG/ML solution. Multiplanar reformatted images are provided for review. Automated exposure control, iterative reconstruction, and/or weight-based adjustment of the mA/kV was utilized to reduce the radiation dose to as low as reasonably achievable. COMPARISON: None available. CLINICAL HISTORY: L sided abd pain, new AKI, n/v FINDINGS: LOWER CHEST: No acute abnormality. LIVER: The liver is unremarkable. GALLBLADDER AND BILE DUCTS: Gallbladder is unremarkable. No biliary ductal dilatation. SPLEEN: No acute abnormality. PANCREAS: No acute abnormality. ADRENAL GLANDS: No acute abnormality. KIDNEYS, URETERS AND BLADDER: Left nephrolithiasis is noted. Moderate left  hydroureteronephrosis is noted with perinephric stranding and ureteral wall thickening secondary to multiple distal left ureteral calculi, the largest measuring 6 mm. Findings are concerning for associated pyelonephritis. No stones in the right kidney or ureter. No right hydronephrosis. No right perinephric or periureteral stranding. Urinary bladder is unremarkable. GI AND BOWEL: Stomach demonstrates no acute abnormality. There is no bowel obstruction. PERITONEUM AND RETROPERITONEUM: No ascites. No free air. VASCULATURE: Aorta is normal in caliber. LYMPH NODES: No lymphadenopathy. REPRODUCTIVE ORGANS: No acute abnormality. BONES AND SOFT TISSUES: No acute osseous abnormality. No focal soft tissue abnormality. IMPRESSION: 1. Left nephrolithiasis with moderate left hydroureteronephrosis due to multiple distal left ureteral calculi, largest 6 mm. 2. Findings suspicious for acute pyelonephritis. Electronically signed by: Lynwood Seip MD 04/18/2024 01:03 PM EST RP Workstation: [id number]    Microbiology: Recent Results (from the past 240 hours)  Blood Culture (routine x 2)     Status: None (Preliminary result)   Collection Time: 04/18/24 10:54 AM   Specimen: BLOOD LEFT ARM  Result Value Ref Range Status   Specimen Description BLOOD LEFT ARM  Final   Special Requests   Final    BOTTLES DRAWN AEROBIC AND ANAEROBIC Blood Culture adequate volume   Culture   Final    NO GROWTH 2 DAYS Performed at [facility], [address] [place], [place] [address]    Report Status PENDING  Incomplete  Blood Culture (routine x 2)     Status: None (Preliminary result)   Collection Time: 04/18/24 10:54 AM   Specimen: BLOOD RIGHT ARM  Result Value Ref Range Status   Specimen Description BLOOD RIGHT ARM  Final   Special Requests   Final    BOTTLES DRAWN AEROBIC AND ANAEROBIC Blood Culture adequate volume   Culture   Final    NO GROWTH 2 DAYS Performed at [facility], [address],  [place], [place] [address]    Report Status PENDING  Incomplete  Urine Culture     Status: None   Collection Time: 04/18/24  5:07 PM   Specimen: Urine, Cystoscope  Result Value Ref Range Status   Specimen Description   Final    URINE, RANDOM Performed at [facility], [address] [place], [place] [address]    Special Requests   Final    NONE Performed at [facility], [address], [place], [place] [address]    Culture   Final    NO GROWTH Performed at [facility], [address], [place], [place] [address]    Report Status 04/19/2024 FINAL  Final     Labs: CBC: Recent Labs  Lab  04/18/24 0916 04/19/24 0855 04/20/24 0534  WBC 22.3* 18.2* 15.6*  HGB 13.2 12.0* 12.6*  HCT 39.1 35.1* 37.6*  MCV 89.3 88.6 91.0  PLT 289 311 356   Basic Metabolic Panel: Recent Labs  Lab 04/18/24 0916 04/19/24 0855 04/20/24 0534  NA 135 137 141  K 4.1 4.7 4.3  CL 98 100 105  CO2 26 27 26   GLUCOSE 119* 128* 112*  BUN 25* 30* 32*  CREATININE 1.85* 1.37* 1.41*  CALCIUM 9.2 8.9 8.9  MG  --  2.8* 2.8*  PHOS  --  3.5 3.1   Liver Function Tests: Recent Labs  Lab 04/18/24 0916  AST 21  ALT 26  ALKPHOS 124  BILITOT 0.6  PROT 8.1  ALBUMIN 3.9   Recent Labs  Lab 04/18/24 0916  LIPASE 21   No results for input(s): AMMONIA in the last 168 hours. Cardiac Enzymes: No results for input(s): CKTOTAL, CKMB, CKMBINDEX, TROPONINI in the last 168 hours. BNP (last 3 results) No results for input(s): BNP in the last 8760 hours. CBG: No results for input(s): GLUCAP in the last 168 hours.  Time spent: 35 minutes  Signed:  Elvan Sor  [facility] 04/20/2024 2:41 PM

## 2024-04-20 NOTE — Progress Notes (Signed)
 Foley removed after deflating balloon. No issues or discomfort. Pt will call when he voids.

## 2024-04-20 NOTE — Plan of Care (Signed)

## 2024-04-23 ENCOUNTER — Other Ambulatory Visit: Payer: Self-pay

## 2024-04-23 DIAGNOSIS — N201 Calculus of ureter: Secondary | ICD-10-CM

## 2024-04-23 LAB — CULTURE, BLOOD (ROUTINE X 2)
Culture: NO GROWTH
Culture: NO GROWTH
Special Requests: ADEQUATE
Special Requests: ADEQUATE

## 2024-04-23 NOTE — Progress Notes (Unsigned)
 Surgical Physician Order Form [facility] [place]  Dr. Estanislao Heimlich * Scheduling expectation : 3-4 weeks  *Length of Case:   *Clearance needed: no  *Anticoagulation Instructions: N/A  *Aspirin  Instructions: N/A  *Post-op visit Date/Instructions:  TBD  *Diagnosis: Left Ureteral Stone  *Procedure: left  Ureteroscopy w/laser lithotripsy & stent exchange (09811)   Additional orders: N/A  -Admit type: OUTpatient  -Anesthesia: General  -VTE Prophylaxis Standing Order SCD's       Other:   -Standing Lab Orders Per Anesthesia    Lab other: None  -Standing Test orders EKG/Chest x-ray per Anesthesia       Test other:   - Medications:  Ancef  2gm IV  -Other orders:  N/A

## 2024-04-24 ENCOUNTER — Telehealth: Payer: Self-pay

## 2024-04-24 NOTE — Telephone Encounter (Signed)
 Per Dr. Francisca, Patient is to be scheduled for  Left Ureteroscopy with Laser Lithotripsy and Stent Exchange   Jordan Dunn was contacted and possible surgical dates were discussed, Monday December 8th, 2025 was agreed upon for surgery.   Patient was directed to call [phone] between 1-3pm the day before surgery to find out surgical arrival time.  Instructions were given not to eat or drink from midnight on the night before surgery and have a driver for the day of surgery. On the surgery day patient was instructed to enter through the [facility] entrance of [facility] report the Same Day Surgery desk.   Pre-Admit Testing will be in contact via phone to set up an interview with the anesthesia team to review your history and medications prior to surgery.   Reminder of this information was sent via Mail to the patient.

## 2024-04-24 NOTE — Progress Notes (Signed)
    Urology-[facility] Surgical Posting Form  Surgery Date: Date: 05/14/2024  Surgeon: Dr. Redell Burnet, MD  Inpt ( No  )   Outpt (Yes)   Obs ( No  )   Diagnosis: N20.1 Left Ureteral Stone  -CPT: [phone]  Surgery: Left Ureteroscopy with Laser Lithotripsy and Stent Exchange  Stop Anticoagulations: No, may continue all and may continue ASA  Cardiac/Medical/Pulmonary Clearance needed: no  *Orders entered into EPIC  Date: 04/24/24   *Case booked in [place]  Date: 04/24/24  *Notified pt of Surgery: Date: 04/24/24  PRE-OP UA & CX: no  *Placed into Prior Authorization Work [place] Date: 04/24/24  Assistant/laser/rep:No

## 2024-04-28 ENCOUNTER — Other Ambulatory Visit: Payer: Self-pay

## 2024-04-28 ENCOUNTER — Emergency Department: Payer: Self-pay

## 2024-04-28 ENCOUNTER — Inpatient Hospital Stay
Admission: EM | Admit: 2024-04-28 | Discharge: 2024-04-29 | DRG: 872 | Disposition: A | Discharge status: Home / Self Care | Payer: Self-pay | Attending: Internal Medicine | Admitting: Internal Medicine

## 2024-04-28 DIAGNOSIS — N390 Urinary tract infection, site not specified: Secondary | ICD-10-CM | POA: Diagnosis present

## 2024-04-28 DIAGNOSIS — Z1152 Encounter for screening for COVID-19: Secondary | ICD-10-CM

## 2024-04-28 DIAGNOSIS — N200 Calculus of kidney: Secondary | ICD-10-CM | POA: Diagnosis present

## 2024-04-28 DIAGNOSIS — I10 Essential (primary) hypertension: Secondary | ICD-10-CM | POA: Diagnosis present

## 2024-04-28 DIAGNOSIS — N179 Acute kidney failure, unspecified: Secondary | ICD-10-CM | POA: Diagnosis present

## 2024-04-28 DIAGNOSIS — E875 Hyperkalemia: Secondary | ICD-10-CM | POA: Diagnosis present

## 2024-04-28 DIAGNOSIS — Z87442 Personal history of urinary calculi: Secondary | ICD-10-CM

## 2024-04-28 DIAGNOSIS — Z79899 Other long term (current) drug therapy: Secondary | ICD-10-CM

## 2024-04-28 DIAGNOSIS — F17210 Nicotine dependence, cigarettes, uncomplicated: Secondary | ICD-10-CM | POA: Diagnosis present

## 2024-04-28 DIAGNOSIS — N3000 Acute cystitis without hematuria: Secondary | ICD-10-CM

## 2024-04-28 DIAGNOSIS — A419 Sepsis, unspecified organism: Principal | ICD-10-CM | POA: Diagnosis present

## 2024-04-28 LAB — URINALYSIS, ROUTINE W REFLEX MICROSCOPIC
Bacteria, UA: NONE SEEN
Bilirubin Urine: NEGATIVE
Glucose, UA: NEGATIVE mg/dL
Ketones, ur: NEGATIVE mg/dL
Nitrite: NEGATIVE
Protein, ur: >=300 mg/dL — AB
RBC / HPF: >50 RBC/hpf (ref 0–5)
Specific Gravity, Urine: 1.018 (ref 1.005–1.030)
WBC, UA: >50 WBC/hpf (ref 0–5)
pH: 7 (ref 5.0–8.0)

## 2024-04-28 LAB — PROTIME-INR
INR: 1.1 (ref 0.8–1.2)
Prothrombin Time: 14.9 s (ref 11.4–15.2)

## 2024-04-28 LAB — RESP PANEL BY RT-PCR (RSV, FLU A&B, COVID)  RVPGX2
Influenza A by PCR: NEGATIVE
Influenza B by PCR: NEGATIVE
Resp Syncytial Virus by PCR: NEGATIVE
SARS Coronavirus 2 by RT PCR: NEGATIVE

## 2024-04-28 LAB — COMPREHENSIVE METABOLIC PANEL WITH GFR
ALT: 27 U/L (ref 0–44)
AST: 25 U/L (ref 15–41)
Albumin: 4.1 g/dL (ref 3.5–5.0)
Alkaline Phosphatase: 130 U/L — ABNORMAL HIGH (ref 38–126)
Anion gap: 9 (ref 5–15)
BUN: 19 mg/dL (ref 6–20)
CO2: 24 mmol/L (ref 22–32)
Calcium: 9.2 mg/dL (ref 8.9–10.3)
Chloride: 100 mmol/L (ref 98–111)
Creatinine, Ser: 2.04 mg/dL — ABNORMAL HIGH (ref 0.61–1.24)
GFR, Estimated: 40 mL/min — ABNORMAL LOW (ref >60)
Glucose, Bld: 148 mg/dL — ABNORMAL HIGH (ref 70–99)
Potassium: 5.5 mmol/L — ABNORMAL HIGH (ref 3.5–5.1)
Sodium: 132 mmol/L — ABNORMAL LOW (ref 135–145)
Total Bilirubin: 0.4 mg/dL (ref 0.0–1.2)
Total Protein: 8.4 g/dL — ABNORMAL HIGH (ref 6.5–8.1)

## 2024-04-28 LAB — CBC
HCT: 37.6 % — ABNORMAL LOW (ref 39.0–52.0)
Hemoglobin: 12.3 g/dL — ABNORMAL LOW (ref 13.0–17.0)
MCH: 30.4 pg (ref 26.0–34.0)
MCHC: 32.7 g/dL (ref 30.0–36.0)
MCV: 92.8 fL (ref 80.0–100.0)
Platelets: 513 K/uL — ABNORMAL HIGH (ref 150–400)
RBC: 4.05 MIL/uL — ABNORMAL LOW (ref 4.22–5.81)
RDW: 14.2 % (ref 11.5–15.5)
WBC: 18.8 K/uL — ABNORMAL HIGH (ref 4.0–10.5)
nRBC: 0 % (ref 0.0–0.2)

## 2024-04-28 LAB — MAGNESIUM: Magnesium: 2.2 mg/dL (ref 1.7–2.4)

## 2024-04-28 LAB — LACTIC ACID, PLASMA: Lactic Acid, Venous: 1.3 mmol/L (ref 0.5–1.9)

## 2024-04-28 LAB — LIPASE, BLOOD: Lipase: 37 U/L (ref 11–51)

## 2024-04-28 MED ORDER — LACTATED RINGERS IV SOLN: INTRAVENOUS | Status: AC

## 2024-04-28 MED ORDER — ACETAMINOPHEN 650 MG RE SUPP: 650.0000 mg | Freq: Four times a day (QID) | RECTAL | Status: DC | PRN

## 2024-04-28 MED ORDER — MORPHINE SULFATE (PF) 4 MG/ML IV SOLN
4.0000 mg | Freq: Once | INTRAVENOUS | Status: AC
  Administered 2024-04-28: 4 mg via INTRAVENOUS
  Filled 2024-04-28: qty 1

## 2024-04-28 MED ORDER — LACTATED RINGERS IV BOLUS (SEPSIS)
1000.0000 mL | Freq: Once | INTRAVENOUS | Status: AC
  Administered 2024-04-28: 1000 mL via INTRAVENOUS

## 2024-04-28 MED ORDER — SENNOSIDES-DOCUSATE SODIUM 8.6-50 MG PO TABS
1.0000 | ORAL_TABLET | Freq: Every evening | ORAL | Status: DC | PRN
Start: 2024-04-28 — End: 2024-04-29

## 2024-04-28 MED ORDER — SODIUM CHLORIDE 0.9% FLUSH
3.0000 mL | Freq: Two times a day (BID) | INTRAVENOUS | Status: DC
  Administered 2024-04-28 – 2024-04-29 (×2): 3 mL via INTRAVENOUS

## 2024-04-28 MED ORDER — ACETAMINOPHEN 325 MG PO TABS
650.0000 mg | ORAL_TABLET | Freq: Four times a day (QID) | ORAL | Status: DC | PRN
Start: 2024-04-28 — End: 2024-04-29
  Administered 2024-04-28: 650 mg via ORAL
  Filled 2024-04-28: qty 2

## 2024-04-28 MED ORDER — ONDANSETRON HCL 4 MG/2ML IJ SOLN
4.0000 mg | Freq: Once | INTRAMUSCULAR | Status: AC
  Administered 2024-04-28: 4 mg via INTRAVENOUS
  Filled 2024-04-28: qty 2

## 2024-04-28 MED ORDER — SODIUM CHLORIDE 0.9 % IV SOLN
2.0000 g | Freq: Once | INTRAVENOUS | Status: AC
  Administered 2024-04-28: 2 g via INTRAVENOUS
  Filled 2024-04-28: qty 12.5

## 2024-04-28 MED ORDER — HEPARIN SODIUM (PORCINE) 5000 UNIT/ML IJ SOLN
5000.0000 [IU] | Freq: Three times a day (TID) | INTRAMUSCULAR | Status: DC
  Administered 2024-04-28 – 2024-04-29 (×2): 5000 [IU] via SUBCUTANEOUS

## 2024-04-28 MED ORDER — SODIUM CHLORIDE 0.9 % IV SOLN
1.0000 g | INTRAVENOUS | Status: DC
  Administered 2024-04-28: 1 g via INTRAVENOUS
  Filled 2024-04-28: qty 10

## 2024-04-28 NOTE — ED Triage Notes (Signed)
 Pt to ED for RLQ pain started this am, dx with kidney stones last week.

## 2024-04-28 NOTE — Consult Note (Signed)
 CODE SEPSIS - PHARMACY COMMUNICATION  **Broad Spectrum Antibiotics should be administered within 1 hour of Sepsis diagnosis**  Time Code Sepsis Called/Page Received: 1012  Antibiotics Ordered: cefepime   Time of 1st antibiotic administration: 1038  Additional action taken by pharmacy: N/A   Kayla JULIANNA Blew ,PharmD Clinical Pharmacist  04/28/2024  10:19 AM

## 2024-04-28 NOTE — H&P (Addendum)
 History and Physical    Jordan Dunn:[id number] DOB: 08-09-1975 DOA: 04/28/2024  DOS: the patient was seen and examined on 04/28/2024  PCP: Pcp, No   Patient coming from: Home  I have personally briefly reviewed patient's old medical records in [facility] and CareEverywhere  HPI:   Jordan Dunn is a 48 y.o. year old male with medical history of hypertension, nephrolithiasis status post stent placement presented to ED with acute flank pain.   Pt states he developed right sided flank pain prompting him to present to the ED. He was feeling well otherwise. He had some nausea but denies any dysuria prior to this.    On arrival to the ED patient was noted to be HDS stable.  Lab work and imaging obtained.  CBC with leukocytosis to 18.8 which is slightly above his previous leukocyte count.  CMP with slight hyperkalemia at 5.5, creatinine elevation at 2.04 from his baseline at 1.4.  Lipase normal.  UA with signs of infection including moderate leukocyte count and elevated WBCs.  Imaging showed tiny 3 mm stone in the bladder near the right UVJ.  EDP discussed with urology and they recommend no intervention from their point of view given it is a stone that has passed.  Given patient's AKI, [facility] contacted for admission.  Review of Systems: As mentioned in the history of present illness. All other systems reviewed and are negative.   Past Medical History:  Diagnosis Date   Hypertension     Past Surgical History:  Procedure Laterality Date   CYSTOSCOPY W/ URETERAL STENT PLACEMENT Left 04/18/2024   Procedure: CYSTOSCOPY, WITH RETROGRADE PYELOGRAM AND URETERAL STENT INSERTION;  Surgeon: Francisca Redell BROCKS, MD;  Location: [facility] ORS;  Service: Urology;  Laterality: Left;     No Known Allergies  History reviewed. No pertinent family history.  Prior to Admission medications   Medication Sig Start Date End Date Taking? Authorizing Provider  amLODipine  (NORVASC ) 10 MG tablet Take 1  tablet (10 mg total) by mouth daily. 04/20/24 04/20/25  Von Bellis, MD  carvedilol  (COREG ) 6.25 MG tablet Take 1 tablet (6.25 mg total) by mouth 2 (two) times daily with a meal. 04/20/24 04/20/25  Von Bellis, MD  hydrALAZINE  (APRESOLINE ) 50 MG tablet Take 1 tablet (50 mg total) by mouth 3 (three) times daily as needed (Systolic BP greater than >150). 04/20/24   Von Bellis, MD  sulfamethoxazole -trimethoprim  (BACTRIM  DS) 800-160 MG tablet Take 1 tablet by mouth every 12 (twelve) hours for 7 days. 04/21/24 04/28/24  Von Bellis, MD    Social History:  reports that he has been smoking cigarettes. He started smoking about 24 years ago. He has a 12.4 pack-year smoking history. He has never used smokeless tobacco. He reports that he does not currently use alcohol. He reports that he does not use drugs. Lives with uncle and girlfriend Tobacco- 1/2 ppd EtOH- Denies use.  Illicit drug use- denies use.  IADLs/ADLs- can perform independently at baseline    Physical Exam: Vitals:   04/28/24 1015 04/28/24 1100 04/28/24 1639 04/28/24 1956  BP:  136/89 (!) 148/83 (!) 141/71  Pulse:  91 (!) 103 90  Resp:  18 20 16   Temp:   99.3 F (37.4 C) 98.7 F (37.1 C)  TempSrc:    Oral  SpO2: 100% 100% 100% 98%  Weight:      Height:         Gen: NAD HENT: NCAT CV: RRR. Good pulses in all extremities  Resp: CTAB Abd: No TTP, normal bowel sounds MSK: no asymmetry Neuro: alert and oriented x4 Psych: pleasant mood.   Labs on Admission: I have personally reviewed following labs and imaging studies  CBC: Recent Labs  Lab 04/28/24 0857  WBC 18.8*  HGB 12.3*  HCT 37.6*  MCV 92.8  PLT 513*   Basic Metabolic Panel: Recent Labs  Lab 04/28/24 0857  NA 132*  K 5.5*  CL 100  CO2 24  GLUCOSE 148*  BUN 19  CREATININE 2.04*  CALCIUM 9.2  MG 2.2   GFR: Estimated Creatinine Clearance: 47.4 mL/min (A) (by C-G formula based on SCr of 2.04 mg/dL (H)). Liver Function Tests: Recent Labs   Lab 04/28/24 0857  AST 25  ALT 27  ALKPHOS 130*  BILITOT 0.4  PROT 8.4*  ALBUMIN 4.1   Recent Labs  Lab 04/28/24 0857  LIPASE 37   No results for input(s): AMMONIA in the last 168 hours. Coagulation Profile: Recent Labs  Lab 04/28/24 1022  INR 1.1   Cardiac Enzymes: No results for input(s): CKTOTAL, CKMB, CKMBINDEX, TROPONINI, TROPONINIHS in the last 168 hours. BNP (last 3 results) No results for input(s): BNP in the last 8760 hours. HbA1C: No results for input(s): HGBA1C in the last 72 hours. CBG: No results for input(s): GLUCAP in the last 168 hours. Lipid Profile: No results for input(s): CHOL, HDL, LDLCALC, TRIG, CHOLHDL, LDLDIRECT in the last 72 hours. Thyroid Function Tests: No results for input(s): TSH, T4TOTAL, FREET4, T3FREE, THYROIDAB in the last 72 hours. Anemia Panel: No results for input(s): VITAMINB12, FOLATE, FERRITIN, TIBC, IRON, RETICCTPCT in the last 72 hours. Urine analysis:    Component Value Date/Time   COLORURINE YELLOW (A) 04/28/2024 0900   APPEARANCEUR HAZY (A) 04/28/2024 0900   LABSPEC 1.018 04/28/2024 0900   PHURINE 7.0 04/28/2024 0900   GLUCOSEU NEGATIVE 04/28/2024 0900   HGBUR SMALL (A) 04/28/2024 0900   BILIRUBINUR NEGATIVE 04/28/2024 0900   KETONESUR NEGATIVE 04/28/2024 0900   PROTEINUR >=300 (A) 04/28/2024 0900   NITRITE NEGATIVE 04/28/2024 0900   LEUKOCYTESUR MODERATE (A) 04/28/2024 0900    Radiological Exams on Admission: I have personally reviewed images CT ABDOMEN PELVIS WO CONTRAST Result Date: 04/28/2024 EXAM: CT ABDOMEN AND PELVIS WITHOUT CONTRAST 04/28/2024 10:56:23 AM TECHNIQUE: CT of the abdomen and pelvis was performed without the administration of intravenous contrast. Multiplanar reformatted images are provided for review. Automated exposure control, iterative reconstruction, and/or weight-based adjustment of the mA/kV was utilized to reduce the radiation dose to as  low as reasonably achievable. COMPARISON: 04/18/2024 CLINICAL HISTORY: RLQ abd pain, recent urosepsis. FINDINGS: LOWER CHEST: No acute abnormality. LIVER: The liver is unremarkable. GALLBLADDER AND BILE DUCTS: Gallbladder is unremarkable. No biliary ductal dilatation. SPLEEN: The spleen is within normal limits in size and appearance. PANCREAS: Pancreas is normal in size and contour without focal lesion or ductal dilatation. ADRENAL GLANDS: Normal size and morphology bilaterally. No nodule, thickening, or hemorrhage. No periadrenal stranding. KIDNEYS, URETERS AND BLADDER: Interval placement of a left-sided nephroureteral stent with proximal and distal pigtails and appropriate position. Two small stones are noted within the inferior pole of the right kidney measuring up to 3 mm, unchanged in position from previous study. No stone identified along the course of the ureteral stent. Mild left perinephric and periureteral fat stranding is improved from the prior exam. Tiny punctate stone is identified within the urinary bladder on the right at the level of the right UVJ new from previous exam measuring 3 mm, coronal  image 68/5 and axial image 77/2. No significant right hydronephrosis. No hydronephrosis on the left. No perinephric or periureteral stranding on the right. GI AND BOWEL: Stomach demonstrates no acute abnormality. The appendix is visualized and normal in caliber, without wall thickening, periappendiceal inflammation, or fluid. There is no bowel obstruction. PERITONEUM AND RETROPERITONEUM: No ascites. No free air. VASCULATURE: Aorta is normal in caliber. LYMPH NODES: No lymphadenopathy. REPRODUCTIVE ORGANS: Normal prostate gland. BONES AND SOFT TISSUES: No acute osseous abnormality. No focal soft tissue abnormality. IMPRESSION: 1. Tiny 3 mm stone within the urinary bladder at the right UVJ, new from prior exam. This does not result in significant right hydroureter or hydronephrosis but likely accounts for the  patient's new right lower quadrant pain. 2. Interval placement of a left-sided nephroureteral stent in appropriate position. Mild left perinephric and periureteral fat stranding, improved from prior exam. 3. Two 3 mm stones in the inferior pole of the left kidney, unchanged. Electronically signed by: Waddell Calk MD 04/28/2024 11:22 AM EST RP Workstation: [id number]    EKG: My personal interpretation of EKG shows: Sinus rhythm without any acute ST changes.    Assessment/Plan Principal Problem:   Sepsis due to urinary tract infection (HCC) Active Problems:   AKI (acute kidney injury)   Essential hypertension   Pt with UTI admitted for sepsis. Had right sided flank pain likely secondary to passed stone. Getting IV abx and IVF. Urine and blood cultures ordered. Continue ceftriaxone  for now. Has AKI (see below).  AKI: getting IVF. Will trend creatinine. Suspect in setting of infection with pre renal etiology as pt reports good urine output. Monitor renal function daily.  HTN: holding antihypertensives for now. CTM   VTE prophylaxis:  SQ Heparin   Diet: Regular Code Status:  Full Code Telemetry:  Admission status: Observation, Med-Surg Patient is from: Home Anticipated d/c is to: Home Anticipated d/c is in: 1-2 days   Family Communication: Updated at bedside  Consults called: None   Severity of Illness: The appropriate patient status for this patient is OBSERVATION. Observation status is judged to be reasonable and necessary in order to provide the required intensity of service to ensure the patient's safety. The patient's presenting symptoms, physical exam findings, and initial radiographic and laboratory data in the context of their medical condition is felt to place them at decreased risk for further clinical deterioration. Furthermore, it is anticipated that the patient will be medically stable for discharge from the hospital within 2 midnights of admission.    Morene Bathe,  MD [facility] DEL. [facility]

## 2024-04-28 NOTE — ED Provider Notes (Signed)
 [facility] Provider Note    Event Date/Time   First MD Initiated Contact with Patient 04/28/24 1002     (approximate)   History   Abdominal Pain   HPI  Jordan Dunn is a 48 y.o. male who presents today with concern of abdominal pain.  He was just admitted with nephrolithiasis and a UTI, he had stenting done and was able to be discharged home, was given a course of Bactrim  at the time of discharge and he completed the course yesterday.  This morning he was woken up with significant right lower quadrant abdominal pain and nausea.  He denies any other symptoms or complaints at this time primarily lower abdominal pain.  He is having some mild accompanying diarrhea.     Physical Exam   Triage Vital Signs: ED Triage Vitals  Encounter Vitals Group     BP 04/28/24 0854 (!) 140/93     Girls Systolic BP Percentile --      Girls Diastolic BP Percentile --      Boys Systolic BP Percentile --      Boys Diastolic BP Percentile --      Pulse Rate 04/28/24 0854 92     Resp 04/28/24 0854 18     Temp 04/28/24 0854 (!) 97.5 F (36.4 C)     Temp Source 04/28/24 0854 Oral     SpO2 04/28/24 0854 100 %     Weight 04/28/24 0854 165 lb (74.8 kg)     Height 04/28/24 0854 6' 2 (1.88 m)     Head Circumference --      Peak Flow --      Pain Score 04/28/24 0859 8     Pain Loc --      Pain Education --      Exclude from Growth Chart --     Most recent vital signs: Vitals:   04/28/24 1015 04/28/24 1100  BP:  136/89  Pulse:  91  Resp:  18  Temp:    SpO2: 100% 100%     General: Awake, no distress.  CV:  Good peripheral perfusion.  Resp:  Normal effort.  Abd:  No distention.  Soft, discomfort to palpation along the lower abdomen no peritoneal findings Other:     ED Results / Procedures / Treatments   Labs (all labs ordered are listed, but only abnormal results are displayed) Labs Reviewed  COMPREHENSIVE METABOLIC PANEL WITH GFR - Abnormal; Notable for  the following components:      Result Value   Sodium 132 (*)    Potassium 5.5 (*)    Glucose, Bld 148 (*)    Creatinine, Ser 2.04 (*)    Total Protein 8.4 (*)    Alkaline Phosphatase 130 (*)    GFR, Estimated 40 (*)    All other components within normal limits  CBC - Abnormal; Notable for the following components:   WBC 18.8 (*)    RBC 4.05 (*)    Hemoglobin 12.3 (*)    HCT 37.6 (*)    Platelets 513 (*)    All other components within normal limits  URINALYSIS, ROUTINE W REFLEX MICROSCOPIC - Abnormal; Notable for the following components:   Color, Urine YELLOW (*)    APPearance HAZY (*)    Hgb urine dipstick SMALL (*)    Protein, ur >=300 (*)    Leukocytes,Ua MODERATE (*)    All other components within normal limits  RESP PANEL BY RT-PCR (RSV, FLU  A&B, COVID)  RVPGX2  CULTURE, BLOOD (ROUTINE X 2)  CULTURE, BLOOD (ROUTINE X 2)  URINE CULTURE  LIPASE, BLOOD  LACTIC ACID, PLASMA  PROTIME-INR  MAGNESIUM     EKG  Sinus rhythm with rate of about 95, axis was about 80, intervals appear to be within normal limits, no obvious ischemia I appreciate this EKG   RADIOLOGY  PROCEDURES:  Critical Care performed: Yes, see critical care procedure note(s)  .Critical Care  Performed by: Fernand Rossie HERO, MD Authorized by: Fernand Rossie HERO, MD   Critical care provider statement:    Critical care time (minutes):  30   Critical care was time spent personally by me on the following activities:  Development of treatment plan with patient or surrogate, discussions with consultants, evaluation of patient's response to treatment, examination of patient, ordering and review of laboratory studies, ordering and review of radiographic studies, ordering and performing treatments and interventions, pulse oximetry, re-evaluation of patient's condition and review of old charts    MEDICATIONS ORDERED IN ED: Medications  lactated ringers  infusion ( Intravenous New Bag/Given 04/28/24 1045)  sodium  chloride flush (NS) 0.9 % injection 3 mL (3 mLs Intravenous Not Given 04/28/24 1438)  acetaminophen  (TYLENOL ) tablet 650 mg (has no administration in time range)    Or  acetaminophen  (TYLENOL ) suppository 650 mg (has no administration in time range)  senna-docusate (Senokot-S) tablet 1 tablet (has no administration in time range)  heparin  injection 5,000 Units (has no administration in time range)  lactated ringers  bolus 1,000 mL (0 mLs Intravenous Stopped 04/28/24 1438)  ceFEPIme  (MAXIPIME ) 2 g in sodium chloride  0.9 % 100 mL IVPB (0 g Intravenous Stopped 04/28/24 1438)  morphine  (PF) 4 MG/ML injection 4 mg (4 mg Intravenous Given 04/28/24 1036)  ondansetron  (ZOFRAN ) injection 4 mg (4 mg Intravenous Given 04/28/24 1036)     IMPRESSION / MDM / ASSESSMENT AND PLAN / ED COURSE  I reviewed the triage vital signs and the nursing notes.                               Patient's presentation is most consistent with acute presentation with potential threat to life or bodily function.  48 year old male who was just discharged here recently for concern of infected kidney stone and urosepsis he presents again today for right lower quadrant abdominal pain.  Here found to have a significant leukocytosis, and he also has developed to the AKI which was resolving at the time of discharge.  He is slightly tachycardic and meets criteria for possible sepsis.  Sepsis workup was initiated, I am obtaining repeat CT imaging unfortunately given his AKI I believe reasonable to obtain imaging without contrast here.  Will go ahead and initiate antibiotics for coverage of a UTI and anticipate will likely warrant admission.   Clinical Course as of 04/28/24 1451  Sat Apr 28, 2024  1144 Patient now with a 3 mm stone in his right UVJ, which likely explains his abdominal pain, concern for again the urosepsis that he had prior.  I am reaching out to urology to discuss management. [SK]  1240 Spoke with Dr. Sherrilee from  urology who reviewed imaging, feel should pass without intervention and no further acute urologic intervention at this point. Pain is fortunately improved. Will reach out to medicine for further recommendations and management. [SK]  1403 Spoke with Dr. Fernand from medicine service was agreed to evaluate the patient determine course  and further medical management this time. [SK]    Clinical Course User Index [SK] Fernand Rossie HERO, MD     FINAL CLINICAL IMPRESSION(S) / ED DIAGNOSES   Final diagnoses:  Sepsis without acute organ dysfunction, due to unspecified organism [facility])  Acute cystitis without hematuria  Nephrolithiasis     Rx / DC Orders   ED Discharge Orders     None        Note:  This document was prepared using Dragon voice recognition software and may include unintentional dictation errors.   Fernand Rossie HERO, MD 04/28/24 [phone]

## 2024-04-28 NOTE — Plan of Care (Signed)
   Problem: Education: Goal: Knowledge of General Education information will improve Description Including pain rating scale, medication(s)/side effects and non-pharmacologic comfort measures Outcome: Progressing   Problem: Health Behavior/Discharge Planning: Goal: Ability to manage health-related needs will improve Outcome: Progressing

## 2024-04-28 NOTE — Plan of Care (Signed)

## 2024-04-28 NOTE — Sepsis Progress Note (Signed)
 Sepsis protocol is being followed by [facility].

## 2024-04-28 NOTE — ED Notes (Signed)
 NURSE ASHLEY RN INFORMED OF BED ASSIGNED

## 2024-04-29 ENCOUNTER — Other Ambulatory Visit: Payer: Self-pay

## 2024-04-29 DIAGNOSIS — N200 Calculus of kidney: Secondary | ICD-10-CM

## 2024-04-29 LAB — BASIC METABOLIC PANEL WITH GFR
Anion gap: 9 (ref 5–15)
BUN: 17 mg/dL (ref 6–20)
CO2: 24 mmol/L (ref 22–32)
Calcium: 8.6 mg/dL — ABNORMAL LOW (ref 8.9–10.3)
Chloride: 102 mmol/L (ref 98–111)
Creatinine, Ser: 1.52 mg/dL — ABNORMAL HIGH (ref 0.61–1.24)
GFR, Estimated: 57 mL/min — ABNORMAL LOW (ref >60)
Glucose, Bld: 97 mg/dL (ref 70–99)
Potassium: 4.6 mmol/L (ref 3.5–5.1)
Sodium: 135 mmol/L (ref 135–145)

## 2024-04-29 LAB — CBC
HCT: 35.3 % — ABNORMAL LOW (ref 39.0–52.0)
Hemoglobin: 11.5 g/dL — ABNORMAL LOW (ref 13.0–17.0)
MCH: 30.3 pg (ref 26.0–34.0)
MCHC: 32.6 g/dL (ref 30.0–36.0)
MCV: 93.1 fL (ref 80.0–100.0)
Platelets: 438 K/uL — ABNORMAL HIGH (ref 150–400)
RBC: 3.79 MIL/uL — ABNORMAL LOW (ref 4.22–5.81)
RDW: 14.1 % (ref 11.5–15.5)
WBC: 8.8 K/uL (ref 4.0–10.5)
nRBC: 0 % (ref 0.0–0.2)

## 2024-04-29 MED ORDER — CEFADROXIL 500 MG PO CAPS
500.0000 mg | ORAL_CAPSULE | Freq: Two times a day (BID) | ORAL | 0 refills | Status: AC
  Filled 2024-04-29: qty 10, 5d supply, fill #0

## 2024-04-29 NOTE — Plan of Care (Signed)

## 2024-04-29 NOTE — Discharge Summary (Signed)
 Physician Discharge Summary   Patient: Jordan Dunn MRN: [id number] DOB: 1976-05-18  Admit date:     04/28/2024  Discharge date: 04/29/24  Discharge Physician: Jordan Dunn   PCP: Pcp, No   Recommendations at discharge:   Keep scheduled follow-up appointment with urology  Discharge Diagnoses: Principal Problem:   Sepsis due to urinary tract infection (HCC) Active Problems:   AKI (acute kidney injury)   Essential hypertension   Nephrolithiasis  Resolved Problems:   * No resolved hospital problems. *  Hospital Course: Jordan Dunn is a 48 y.o. year old male with medical history of hypertension, nephrolithiasis status post stent placement presented to ED with acute flank pain.     Pt states he developed right sided flank pain prompting him to present to the ED. He was feeling well otherwise. He had some nausea but denies any dysuria prior to this.      On arrival to the ED patient was noted to be HDS stable.  Lab work and imaging obtained.  CBC with leukocytosis to 18.8 which is slightly above his previous leukocyte count.  CMP with slight hyperkalemia at 5.5, creatinine elevation at 2.04 from his baseline at 1.4.  Lipase normal.  UA with signs of infection including moderate leukocyte count and elevated WBCs.  Imaging showed tiny 3 mm stone in the bladder near the right UVJ.  EDP discussed with urology and they recommend no intervention from their point of view given it is a stone that has passed.  Given patient's AKI, [facility] contacted for admission.    Assessment and Plan:   Nephrolithiasis Right flank pain Patient presented to the ER for evaluation of right-sided flank pain and was found to have worsening leukocytosis on admission with associated pyuria. Sepsis was ruled out on admission as patient was afebrile and patient had a normal lactic acid level. CT scan of abdomen and pelvis done without contrast showed a tiny 3 mm stone within the urinary bladder at the right  UVJ, new from prior exam. This does not result in significant right hydroureter or hydronephrosis but likely accounts for the patient's new right lower quadrant pain. Interval placement of a left-sided nephroureteral stent in appropriate position. Mild left perinephric and periureteral fat stranding, improved from prior exam. Two 3 mm stones in the inferior pole of the left kidney, unchanged. Urology was contacted by ER physician who reviewed imaging and felt that patient should pass the stone and will not require any urologic intervention. Patient to follow-up with urology as an outpatient Will discharge home on a course of Levaquin.   Leukocytosis has normalized and patient is afebrile.    AKI with hyperkalemia: Most likely related to recent course of antibiotics Patient was discharged home on Bactrim  and has completed his course Serum creatinine on admission was 2.04 compared to baseline of 1.4 over a week ago His renal function has improved with IV fluid hydration and serum creatinine on discharge is 1.5.  Potassium level is within normal limits     HTN:  Blood pressure stable Continue amlodipine  and carvedilol           Consultants: None Procedures performed: None Disposition: Home Diet recommendation:  Discharge Diet Orders (From admission, onward)     Start     Ordered   04/29/24 0000  Diet - low sodium heart healthy        04/29/24 1040           Cardiac diet DISCHARGE MEDICATION: Allergies as of  04/29/2024   No Known Allergies      Medication List     STOP taking these medications    sulfamethoxazole -trimethoprim  800-160 MG tablet Commonly known as: BACTRIM  DS       TAKE these medications    amLODipine  10 MG tablet Commonly known as: NORVASC  Take 1 tablet (10 mg total) by mouth daily.   carvedilol  6.25 MG tablet Commonly known as: COREG  Take 1 tablet (6.25 mg total) by mouth 2 (two) times daily with a meal.   cefadroxil  500 MG  capsule Commonly known as: DURICEF Take 1 capsule (500 mg total) by mouth 2 (two) times daily for 5 days.   hydrALAZINE  50 MG tablet Commonly known as: APRESOLINE  Take 1 tablet (50 mg total) by mouth 3 (three) times daily as needed (Systolic BP greater than >150).        Follow-up Information     Jordan Redell BROCKS, MD Follow up on 05/14/2024.   Specialty: Urology Contact information: [address] [place] [address] [phone]                Discharge Exam: Jordan Dunn   04/28/24 0854 04/29/24 0500  Weight: 74.8 kg 74.8 kg    Gen: Appears comfortable and in no distress HENT: NCAT CV: RRR. Good pulses in all extremities Resp: Clear to auscultation bilaterally Abd: Bowel sounds present, soft, nontender, no CVA tenderness MSK: no asymmetry Neuro: alert and oriented x4 Psych: pleasant mood.    Condition at discharge: stable  The results of significant diagnostics from this hospitalization (including imaging, microbiology, ancillary and laboratory) are listed below for reference.   Imaging Studies: CT ABDOMEN PELVIS WO CONTRAST Result Date: 04/28/2024 EXAM: CT ABDOMEN AND PELVIS WITHOUT CONTRAST 04/28/2024 10:56:23 AM TECHNIQUE: CT of the abdomen and pelvis was performed without the administration of intravenous contrast. Multiplanar reformatted images are provided for review. Automated exposure control, iterative reconstruction, and/or weight-based adjustment of the mA/kV was utilized to reduce the radiation dose to as low as reasonably achievable. COMPARISON: 04/18/2024 CLINICAL HISTORY: RLQ abd pain, recent urosepsis. FINDINGS: LOWER CHEST: No acute abnormality. LIVER: The liver is unremarkable. GALLBLADDER AND BILE DUCTS: Gallbladder is unremarkable. No biliary ductal dilatation. SPLEEN: The spleen is within normal limits in size and appearance. PANCREAS: Pancreas is normal in size and contour without focal lesion or ductal dilatation. ADRENAL GLANDS:  Normal size and morphology bilaterally. No nodule, thickening, or hemorrhage. No periadrenal stranding. KIDNEYS, URETERS AND BLADDER: Interval placement of a left-sided nephroureteral stent with proximal and distal pigtails and appropriate position. Two small stones are noted within the inferior pole of the right kidney measuring up to 3 mm, unchanged in position from previous study. No stone identified along the course of the ureteral stent. Mild left perinephric and periureteral fat stranding is improved from the prior exam. Tiny punctate stone is identified within the urinary bladder on the right at the level of the right UVJ new from previous exam measuring 3 mm, coronal image 68/5 and axial image 77/2. No significant right hydronephrosis. No hydronephrosis on the left. No perinephric or periureteral stranding on the right. GI AND BOWEL: Stomach demonstrates no acute abnormality. The appendix is visualized and normal in caliber, without wall thickening, periappendiceal inflammation, or fluid. There is no bowel obstruction. PERITONEUM AND RETROPERITONEUM: No ascites. No free air. VASCULATURE: Aorta is normal in caliber. LYMPH NODES: No lymphadenopathy. REPRODUCTIVE ORGANS: Normal prostate gland. BONES AND SOFT TISSUES: No acute osseous abnormality. No focal soft tissue abnormality. IMPRESSION:  1. Tiny 3 mm stone within the urinary bladder at the right UVJ, new from prior exam. This does not result in significant right hydroureter or hydronephrosis but likely accounts for the patient's new right lower quadrant pain. 2. Interval placement of a left-sided nephroureteral stent in appropriate position. Mild left perinephric and periureteral fat stranding, improved from prior exam. 3. Two 3 mm stones in the inferior pole of the left kidney, unchanged. Electronically signed by: Waddell Calk MD 04/28/2024 11:22 AM EST RP Workstation: [id number]   DG OR UROLOGY CYSTO IMAGE ([facility] ONLY) Result Date: 04/18/2024 There is  no interpretation for this exam.  This order is for images obtained during a surgical procedure.  Please See Surgeries Tab for more information regarding the procedure.   CT ABDOMEN PELVIS W CONTRAST Result Date: 04/18/2024 EXAM: CT ABDOMEN AND PELVIS WITH CONTRAST 04/18/2024 12:18:16 PM TECHNIQUE: CT of the abdomen and pelvis was performed with the administration of 100 mL of iohexol  (OMNIPAQUE ) 300 MG/ML solution. Multiplanar reformatted images are provided for review. Automated exposure control, iterative reconstruction, and/or weight-based adjustment of the mA/kV was utilized to reduce the radiation dose to as low as reasonably achievable. COMPARISON: None available. CLINICAL HISTORY: L sided abd pain, new AKI, n/v FINDINGS: LOWER CHEST: No acute abnormality. LIVER: The liver is unremarkable. GALLBLADDER AND BILE DUCTS: Gallbladder is unremarkable. No biliary ductal dilatation. SPLEEN: No acute abnormality. PANCREAS: No acute abnormality. ADRENAL GLANDS: No acute abnormality. KIDNEYS, URETERS AND BLADDER: Left nephrolithiasis is noted. Moderate left hydroureteronephrosis is noted with perinephric stranding and ureteral wall thickening secondary to multiple distal left ureteral calculi, the largest measuring 6 mm. Findings are concerning for associated pyelonephritis. No stones in the right kidney or ureter. No right hydronephrosis. No right perinephric or periureteral stranding. Urinary bladder is unremarkable. GI AND BOWEL: Stomach demonstrates no acute abnormality. There is no bowel obstruction. PERITONEUM AND RETROPERITONEUM: No ascites. No free air. VASCULATURE: Aorta is normal in caliber. LYMPH NODES: No lymphadenopathy. REPRODUCTIVE ORGANS: No acute abnormality. BONES AND SOFT TISSUES: No acute osseous abnormality. No focal soft tissue abnormality. IMPRESSION: 1. Left nephrolithiasis with moderate left hydroureteronephrosis due to multiple distal left ureteral calculi, largest 6 mm. 2. Findings  suspicious for acute pyelonephritis. Electronically signed by: Lynwood Seip MD 04/18/2024 01:03 PM EST RP Workstation: [id number]    Microbiology: Results for orders placed or performed during the hospital encounter of 04/28/24  Resp panel by RT-PCR (RSV, Flu A&B, Covid) Anterior Nasal Swab     Status: None   Collection Time: 04/28/24 10:22 AM   Specimen: Anterior Nasal Swab  Result Value Ref Range Status   SARS Coronavirus 2 by RT PCR NEGATIVE NEGATIVE Final    Comment: (NOTE) SARS-CoV-2 target nucleic acids are NOT DETECTED.  The SARS-CoV-2 RNA is generally detectable in upper respiratory specimens during the acute phase of infection. The lowest concentration of SARS-CoV-2 viral copies this assay can detect is 138 copies/mL. A negative result does not preclude SARS-Cov-2 infection and should not be used as the sole basis for treatment or other patient management decisions. A negative result may occur with  improper specimen collection/handling, submission of specimen other than nasopharyngeal swab, presence of viral mutation(s) within the areas targeted by this assay, and inadequate number of viral copies(<138 copies/mL). A negative result must be combined with clinical observations, patient history, and epidemiological information. The expected result is Negative.  Fact Sheet for Patients:  bloggercourse.com  Fact Sheet for Healthcare Providers:  seriousbroker.it  This test is no t  yet approved or cleared by the [place]  FDA and  has been authorized for detection and/or diagnosis of SARS-CoV-2 by FDA under an Emergency Use Authorization (EUA). This EUA will remain  in effect (meaning this test can be used) for the duration of the COVID-19 declaration under Section 564(b)(1) of the Act, 21 U.S.C.section 360bbb-3(b)(1), unless the authorization is terminated  or revoked sooner.       Influenza A by PCR NEGATIVE NEGATIVE  Final   Influenza B by PCR NEGATIVE NEGATIVE Final    Comment: (NOTE) The Xpert Xpress SARS-CoV-2/FLU/RSV plus assay is intended as an aid in the diagnosis of influenza from Nasopharyngeal swab specimens and should not be used as a sole basis for treatment. Nasal washings and aspirates are unacceptable for Xpert Xpress SARS-CoV-2/FLU/RSV testing.  Fact Sheet for Patients: bloggercourse.com  Fact Sheet for Healthcare Providers: seriousbroker.it  This test is not yet approved or cleared by the [place]  FDA and has been authorized for detection and/or diagnosis of SARS-CoV-2 by FDA under an Emergency Use Authorization (EUA). This EUA will remain in effect (meaning this test can be used) for the duration of the COVID-19 declaration under Section 564(b)(1) of the Act, 21 U.S.C. section 360bbb-3(b)(1), unless the authorization is terminated or revoked.     Resp Syncytial Virus by PCR NEGATIVE NEGATIVE Final    Comment: (NOTE) Fact Sheet for Patients: bloggercourse.com  Fact Sheet for Healthcare Providers: seriousbroker.it  This test is not yet approved or cleared by the [place]  FDA and has been authorized for detection and/or diagnosis of SARS-CoV-2 by FDA under an Emergency Use Authorization (EUA). This EUA will remain in effect (meaning this test can be used) for the duration of the COVID-19 declaration under Section 564(b)(1) of the Act, 21 U.S.C. section 360bbb-3(b)(1), unless the authorization is terminated or revoked.  Performed at [facility], [address] [place], [place] [address]   Blood Culture (routine x 2)     Status: None (Preliminary result)   Collection Time: 04/28/24 10:22 AM   Specimen: BLOOD  Result Value Ref Range Status   Specimen Description BLOOD RIGHT ANTECUBITAL  Final   Special Requests   Final    BOTTLES DRAWN AEROBIC AND  ANAEROBIC Blood Culture adequate volume   Culture   Final    NO GROWTH < 24 HOURS Performed at [facility], [address] [place], [place] [address]    Report Status PENDING  Incomplete  Blood Culture (routine x 2)     Status: None (Preliminary result)   Collection Time: 04/28/24 10:22 AM   Specimen: BLOOD  Result Value Ref Range Status   Specimen Description BLOOD LEFT ANTECUBITAL  Final   Special Requests   Final    BOTTLES DRAWN AEROBIC AND ANAEROBIC Blood Culture adequate volume   Culture   Final    NO GROWTH < 24 HOURS Performed at [facility], [address] [place], [place] [address]    Report Status PENDING  Incomplete    Labs: CBC: Recent Labs  Lab 04/28/24 0857 04/29/24 0505  WBC 18.8* 8.8  HGB 12.3* 11.5*  HCT 37.6* 35.3*  MCV 92.8 93.1  PLT 513* 438*   Basic Metabolic Panel: Recent Labs  Lab 04/28/24 0857 04/29/24 0505  NA 132* 135  K 5.5* 4.6  CL 100 102  CO2 24 24  GLUCOSE 148* 97  BUN 19 17  CREATININE 2.04* 1.52*  CALCIUM 9.2 8.6*  MG 2.2  --  Liver Function Tests: Recent Labs  Lab 04/28/24 0857  AST 25  ALT 27  ALKPHOS 130*  BILITOT 0.4  PROT 8.4*  ALBUMIN 4.1   CBG: No results for input(s): GLUCAP in the last 168 hours.  Discharge time spent: greater than 30 minutes.  Signed: Aimee Somerset, MD [facility] 04/29/2024

## 2024-04-30 LAB — URINE CULTURE: Culture: <10000 — AB

## 2024-05-03 LAB — CULTURE, BLOOD (ROUTINE X 2)
Culture: NO GROWTH
Culture: NO GROWTH
Special Requests: ADEQUATE
Special Requests: ADEQUATE

## 2024-05-07 ENCOUNTER — Other Ambulatory Visit: Payer: Self-pay

## 2024-05-07 ENCOUNTER — Encounter
Admission: RE | Admit: 2024-05-07 | Discharge: 2024-05-07 | Disposition: A | Payer: MEDICAID | Source: Ambulatory Visit | Attending: Urology

## 2024-05-07 HISTORY — DX: Personal history of urinary calculi: Z87.442

## 2024-05-07 NOTE — Patient Instructions (Addendum)
 Your procedure is scheduled on: 05/14/24 - Monday Report to the Registration Desk on the 1st floor of the [facility]. To find out your arrival time, please call [phone] between 1PM - 3PM on: 05/11/24 - Friday If your arrival time is 6:00 am, do not arrive before that time as the [facility] entrance doors do not open until 6:00 am.  REMEMBER: Instructions that are not followed completely may result in serious medical risk, up to and including death; or upon the discretion of your surgeon and anesthesiologist your surgery may need to be rescheduled.  Do not eat food or drink any fluids after midnight the night before surgery.  No gum chewing or hard candies.  One week prior to surgery: Stop Anti-inflammatories (NSAIDS) such as Advil, Aleve, Ibuprofen, Motrin, Naproxen, Naprosyn and Aspirin based products such as Excedrin, Goody's Powder, BC Powder.You may take Tylenol  if needed for pain up until the day of surgery.   Stop ANY OVER THE COUNTER supplements until after surgery.   ON THE DAY OF SURGERY ONLY TAKE THESE MEDICATIONS WITH SIPS OF WATER :  amLODipine  (NORVASC )  carvedilol  (COREG )   No Alcohol for 24 hours before or after surgery.  No Smoking including e-cigarettes for 24 hours before surgery.  No chewable tobacco products for at least 6 hours before surgery.  No nicotine patches on the day of surgery.  Do not use any recreational drugs for at least a week (preferably 2 weeks) before your surgery.  Please be advised that the combination of cocaine and anesthesia may have negative outcomes, up to and including death. If you test positive for cocaine, your surgery will be cancelled.  On the morning of surgery brush your teeth with toothpaste and water , you may rinse your mouth with mouthwash if you wish. Do not swallow any toothpaste or mouthwash.  Do not wear jewelry, make-up, hairpins, clips or nail polish.  For welded (permanent) jewelry: bracelets, anklets,  waist bands, etc.  Please have this removed prior to surgery.  If it is not removed, there is a chance that hospital personnel will need to cut it off on the day of surgery.  Do not wear lotions, powders, or perfumes.   Do not shave body hair from the neck down 48 hours before surgery.  Contact lenses, hearing aids and dentures may not be worn into surgery.  Do not bring valuables to the hospital. [facility] is not responsible for any missing/lost belongings or valuables.   Notify your doctor if there is any change in your medical condition (cold, fever, infection).  Wear comfortable clothing (specific to your surgery type) to the hospital.  After surgery, you can help prevent lung complications by doing breathing exercises.  Take deep breaths and cough every 1-2 hours. Your doctor may order a device called an Incentive Spirometer to help you take deep breaths.  If you are being admitted to the hospital overnight, leave your suitcase in the car. After surgery it may be brought to your room.  In case of increased patient census, it may be necessary for you, the patient, to continue your postoperative care in the Same Day Surgery department.  If you are being discharged the day of surgery, you will not be allowed to drive home. You will need a responsible individual to drive you home and stay with you for 24 hours after surgery.   If you are taking public transportation, you will need to have a responsible individual with you.  Please call  the Pre-admissions Testing Dept. at [phone] if you have any questions about these instructions.  Surgery Visitation Policy:  Patients having surgery or a procedure may have two visitors.  Children under the age of 66 must have an adult with them who is not the patient.  Inpatient Visitation:    Visiting hours are 7 a.m. to 8 p.m. Up to four visitors are allowed at one time in a patient room. The visitors may rotate out with other people  during the day.  One visitor age 67 or older may stay with the patient overnight and must be in the room by 8 p.m.   Merchandiser, Retail to address health-related social needs:  [email or web address]

## 2024-05-13 MED ORDER — ORAL CARE MOUTH RINSE: 15.0000 mL | Freq: Once | OROMUCOSAL | Status: AC

## 2024-05-13 MED ORDER — CEFAZOLIN SODIUM-DEXTROSE 2-4 GM/100ML-% IV SOLN
2.0000 g | INTRAVENOUS | Status: AC
  Administered 2024-05-14: 2 g via INTRAVENOUS

## 2024-05-13 MED ORDER — CHLORHEXIDINE GLUCONATE 0.12 % MT SOLN
15.0000 mL | Freq: Once | OROMUCOSAL | Status: AC
  Administered 2024-05-14: 15 mL via OROMUCOSAL

## 2024-05-13 MED ORDER — LACTATED RINGERS IV SOLN: INTRAVENOUS | Status: DC

## 2024-05-14 ENCOUNTER — Ambulatory Visit: Payer: Self-pay | Admitting: Urgent Care

## 2024-05-14 ENCOUNTER — Encounter
Admission: RE | Disposition: A | Discharge status: Home / Self Care | Payer: Self-pay | Source: Home / Self Care | Attending: Urology

## 2024-05-14 ENCOUNTER — Ambulatory Visit: Payer: Self-pay

## 2024-05-14 ENCOUNTER — Other Ambulatory Visit: Payer: Self-pay

## 2024-05-14 ENCOUNTER — Ambulatory Visit: Payer: Self-pay | Admitting: Certified Registered"

## 2024-05-14 ENCOUNTER — Ambulatory Visit
Admission: RE | Admit: 2024-05-14 | Discharge: 2024-05-14 | Disposition: A | Payer: Self-pay | Attending: Urology | Admitting: Urology

## 2024-05-14 ENCOUNTER — Encounter: Payer: Self-pay | Admitting: Urology

## 2024-05-14 DIAGNOSIS — N201 Calculus of ureter: Secondary | ICD-10-CM

## 2024-05-14 DIAGNOSIS — N202 Calculus of kidney with calculus of ureter: Secondary | ICD-10-CM

## 2024-05-14 HISTORY — PX: CYSTOSCOPY/URETEROSCOPY/HOLMIUM LASER/STENT PLACEMENT: SHX6546

## 2024-05-14 SURGERY — CYSTOSCOPY/URETEROSCOPY/HOLMIUM LASER/STENT PLACEMENT
Anesthesia: General | Site: Ureter | Laterality: Left

## 2024-05-14 MED ORDER — IOHEXOL 180 MG/ML  SOLN
INTRAMUSCULAR | Status: DC | PRN
  Administered 2024-05-14: 10 mL

## 2024-05-14 MED ORDER — FENTANYL CITRATE (PF) 100 MCG/2ML IJ SOLN
INTRAMUSCULAR | Status: AC
  Filled 2024-05-14: qty 2

## 2024-05-14 MED ORDER — ACETAMINOPHEN 10 MG/ML IV SOLN
INTRAVENOUS | Status: AC
  Filled 2024-05-14: qty 100

## 2024-05-14 MED ORDER — ACETAMINOPHEN 10 MG/ML IV SOLN
INTRAVENOUS | Status: DC | PRN
  Administered 2024-05-14: 1000 mg via INTRAVENOUS

## 2024-05-14 MED ORDER — STERILE WATER FOR IRRIGATION IR SOLN
Status: DC | PRN
  Administered 2024-05-14: 500 mL

## 2024-05-14 MED ORDER — DEXAMETHASONE SOD PHOSPHATE PF 10 MG/ML IJ SOLN
INTRAMUSCULAR | Status: DC | PRN
  Administered 2024-05-14: 10 mg via INTRAVENOUS

## 2024-05-14 MED ORDER — SODIUM CHLORIDE 0.9 % IR SOLN
Status: DC | PRN
  Administered 2024-05-14: 3000 mL

## 2024-05-14 MED ORDER — LACTATED RINGERS IV SOLN: INTRAVENOUS | Status: DC

## 2024-05-14 MED ORDER — SUCCINYLCHOLINE CHLORIDE 200 MG/10ML IV SOSY
PREFILLED_SYRINGE | INTRAVENOUS | Status: DC | PRN
  Administered 2024-05-14: 100 mg via INTRAVENOUS

## 2024-05-14 MED ORDER — OXYCODONE HCL 5 MG PO TABS: 5.0000 mg | ORAL_TABLET | Freq: Once | ORAL | Status: DC | PRN

## 2024-05-14 MED ORDER — PROPOFOL 10 MG/ML IV BOLUS
INTRAVENOUS | Status: DC | PRN
  Administered 2024-05-14: 200 mg via INTRAVENOUS

## 2024-05-14 MED ORDER — ONDANSETRON HCL 4 MG/2ML IJ SOLN
INTRAMUSCULAR | Status: DC | PRN
  Administered 2024-05-14 (×2): 4 mg via INTRAVENOUS

## 2024-05-14 MED ORDER — ONDANSETRON HCL 4 MG/2ML IJ SOLN: 4.0000 mg | Freq: Once | INTRAMUSCULAR | Status: DC | PRN

## 2024-05-14 MED ORDER — GLYCOPYRROLATE 0.2 MG/ML IJ SOLN
INTRAMUSCULAR | Status: DC | PRN
  Administered 2024-05-14: .2 mg via INTRAVENOUS

## 2024-05-14 MED ORDER — ROCURONIUM BROMIDE 100 MG/10ML IV SOLN
INTRAVENOUS | Status: DC | PRN
  Administered 2024-05-14: 10 mg via INTRAVENOUS
  Administered 2024-05-14: 40 mg via INTRAVENOUS

## 2024-05-14 MED ORDER — FENTANYL CITRATE (PF) 100 MCG/2ML IJ SOLN: 25.0000 ug | INTRAMUSCULAR | Status: DC | PRN

## 2024-05-14 MED ORDER — FENTANYL CITRATE (PF) 100 MCG/2ML IJ SOLN
INTRAMUSCULAR | Status: DC | PRN
  Administered 2024-05-14 (×2): 50 ug via INTRAVENOUS

## 2024-05-14 MED ORDER — NITROFURANTOIN MONOHYD MACRO 100 MG PO CAPS: 100.0000 mg | ORAL_CAPSULE | Freq: Every day | ORAL | 0 refills | Status: AC

## 2024-05-14 MED ORDER — ACETAMINOPHEN 10 MG/ML IV SOLN: 1000.0000 mg | Freq: Once | INTRAVENOUS | Status: DC | PRN

## 2024-05-14 MED ORDER — SUGAMMADEX SODIUM 200 MG/2ML IV SOLN
INTRAVENOUS | Status: DC | PRN
  Administered 2024-05-14: 200 mg via INTRAVENOUS

## 2024-05-14 MED ORDER — TRAMADOL HCL 50 MG PO TABS: 25.0000 mg | ORAL_TABLET | Freq: Four times a day (QID) | ORAL | 0 refills | Status: AC | PRN

## 2024-05-14 MED ORDER — OXYCODONE HCL 5 MG/5ML PO SOLN: 5.0000 mg | Freq: Once | ORAL | Status: DC | PRN

## 2024-05-14 MED ORDER — MIDAZOLAM HCL (PF) 2 MG/2ML IJ SOLN
INTRAMUSCULAR | Status: DC | PRN
  Administered 2024-05-14: 2 mg via INTRAVENOUS

## 2024-05-14 MED ORDER — LIDOCAINE HCL (CARDIAC) PF 100 MG/5ML IV SOSY
PREFILLED_SYRINGE | INTRAVENOUS | Status: DC | PRN
  Administered 2024-05-14: 100 mg via INTRAVENOUS

## 2024-05-14 MED ORDER — CHLORHEXIDINE GLUCONATE 0.12 % MT SOLN
OROMUCOSAL | Status: AC
  Filled 2024-05-14: qty 15

## 2024-05-14 MED ORDER — CEFAZOLIN SODIUM-DEXTROSE 2-4 GM/100ML-% IV SOLN
INTRAVENOUS | Status: AC
  Filled 2024-05-14: qty 100

## 2024-05-14 MED ORDER — MIDAZOLAM HCL 2 MG/2ML IJ SOLN
INTRAMUSCULAR | Status: AC
  Filled 2024-05-14: qty 2

## 2024-05-14 SURGICAL SUPPLY — 25 items
ADHESIVE MASTISOL STRL (MISCELLANEOUS) IMPLANT
BAG DRAIN SIEMENS DORNER NS (MISCELLANEOUS) ×1 IMPLANT
BRUSH SCRUB EZ 4% CHG (MISCELLANEOUS) ×1 IMPLANT
CATH URET FLEX-TIP 2 LUMEN 10F (CATHETERS) IMPLANT
CATH URETL OPEN 5X70 (CATHETERS) IMPLANT
DRAPE UTILITY 15X26 TOWEL STRL (DRAPES) ×1 IMPLANT
DRSG TEGADERM 2-3/8X2-3/4 SM (GAUZE/BANDAGES/DRESSINGS) IMPLANT
FIBER LASER MOSES 365 DFL (Laser) IMPLANT
GLOVE BIOGEL PI IND STRL 7.5 (GLOVE) ×2 IMPLANT
GOWN STRL REUS W/ TWL LRG LVL3 (GOWN DISPOSABLE) ×1 IMPLANT
GOWN STRL REUS W/ TWL XL LVL3 (GOWN DISPOSABLE) ×1 IMPLANT
GUIDEWIRE STR DUAL SENSOR (WIRE) ×1 IMPLANT
KIT TURNOVER CYSTO (KITS) ×1 IMPLANT
PACK CYSTO AR (MISCELLANEOUS) ×1 IMPLANT
SET CYSTO IRRIGATION (SET/KITS/TRAYS/PACK) ×1 IMPLANT
SHEATH NAVIGATOR HD 12/14X36 (SHEATH) IMPLANT
SOL .9 NS 3000ML IRR UROMATIC (IV SOLUTION) ×1 IMPLANT
SOLN STERILE WATER 500 ML (IV SOLUTION) ×1 IMPLANT
STENT URET 6FRX24 CONTOUR (STENTS) IMPLANT
STENT URET 6FRX26 CONTOUR (STENTS) IMPLANT
STENT URET 6FRX28 CONTOUR (STENTS) IMPLANT
SURGILUBE 2OZ TUBE FLIPTOP (MISCELLANEOUS) ×1 IMPLANT
SYR 10ML LL (SYRINGE) ×1 IMPLANT
TUBING THERMACLEAR UROLOGY (TUBING) IMPLANT
VALVE UROSEAL ADJ ENDO (VALVE) IMPLANT

## 2024-05-14 NOTE — Op Note (Signed)
 Date of procedure: 05/14/24  Preoperative diagnosis:  Left ureteral stone Left renal stones  Postoperative diagnosis:  Same  Procedure: Cystoscopy, left retrograde pyelogram with intraoperative interpretation Left ureteroscopy, laser lithotripsy of ureteral stone Left ureteroscopy, laser lithotripsy of renal stones Left ureteral stent placement  Surgeon: Redell Burnet, MD  Anesthesia: General  Complications: None  Intraoperative findings:  Normal bladder, small prostate 2.   Left distal ureteral stone dusted and irrigated free 3.   Multiple left lower pole renal stones dusted 4.   Uncomplicated stent placement with Dangler  EBL: Minimal  Specimens: Stone for analysis  Drains: Left 6 French by 28 cm ureteral stent with Dangler  Indication: Jordan Dunn is a 48 y.o. patient who previously presented with a 6 mm left distal ureteral stone and concern for infection underwent ureteral stent placement.  He has since spontaneously passed a small right sided stone, denies any right sided pain today.  Here for definitive management of his left-sided ureteral and renal stones.  After reviewing the management options for treatment, they elected to proceed with the above surgical procedure(s). We have discussed the potential benefits and risks of the procedure, side effects of the proposed treatment, the likelihood of the patient achieving the goals of the procedure, and any potential problems that might occur during the procedure or recuperation. Informed consent has been obtained.  Description of procedure:  The patient was taken to the operating room and general anesthesia was induced. SCDs were placed for DVT prophylaxis. The patient was placed in the dorsal lithotomy position, prepped and draped in the usual sterile fashion, and preoperative antibiotics were administered. A preoperative time-out was performed.   A 21 French rigid cystoscope was used to intubate the urethra and a  normal-appearing urethra was followed proximally in the bladder.  Prostate was small.  Bladder showed no suspicious lesions.  The left ureteral stent was grasped and pulled to the meatus, and a sensor wire passed through the stent up to the kidney under fluoroscopic vision, the old stent was removed.  A semirigid long ureteroscope was advanced alongside the wire and a 6 mm stone was identified in the distal ureter.  A 365 m laser fiber on settings of 1.0 J and 10 Hz was used to methodically dust the stone, and fragments were irrigated free into the bladder.  I then advanced a digital single-channel flexible ureteroscope over the wire up to the kidney under fluoroscopic vision.  Thorough pyeloscopy showed multiple stones in the left lower pole the largest measuring approximately 7 mm.  The laser on settings of 0.5 J and 80 Hz was used to methodically dust the lower pole stones.  All stones appeared to be smaller than the laser fiber.  A retrograde pyelogram was performed from the proximal ureter and showed no extravasation or filling defects.  A sensor wire was replaced through the scope and the scope removed, no other abnormalities were noted in the ureter.  Rigid cystoscope was backloaded over the wire and a 6 French by 28 cm ureteral stent was placed uneventfully with a curl in the upper pole as well as in the bladder.  Stone fragments were sent for analysis.  Dangler was secured to the dorsal aspect of the phallus with Mastisol and Tegaderm.  Disposition: Stable to PACU  Plan: Follow-up stone analysis Can remove stent at home on Friday morning  Redell Burnet, MD

## 2024-05-14 NOTE — Anesthesia Procedure Notes (Signed)
 Procedure Name: Intubation Date/Time: 05/14/2024 12:02 PM  Performed by: Ledora Duncan, CRNAPre-anesthesia Checklist: Patient identified, Emergency Drugs available, Suction available and Patient being monitored Patient Re-evaluated:Patient Re-evaluated prior to induction Oxygen Delivery Method: Circle system utilized Preoxygenation: Pre-oxygenation with 100% oxygen Induction Type: IV induction Ventilation: Mask ventilation without difficulty Laryngoscope Size: McGrath and 4 Grade View: Grade I Tube type: Oral Tube size: 7.0 mm Number of attempts: 1 Airway Equipment and Method: Stylet Placement Confirmation: ETT inserted through vocal cords under direct vision, positive ETCO2 and breath sounds checked- equal and bilateral Secured at: 21 cm Tube secured with: Tape Dental Injury: Teeth and Oropharynx as per pre-operative assessment

## 2024-05-14 NOTE — Transfer of Care (Signed)
 Immediate Anesthesia Transfer of Care Note  Patient: Jordan Dunn  Procedure(s) Performed: CYSTOSCOPY/URETEROSCOPY/HOLMIUM LASER/STENT PLACEMENT (Left: Ureter)  Patient Location: PACU  Anesthesia Type:General  Level of Consciousness: awake, drowsy, and patient cooperative  Airway & Oxygen Therapy: Patient Spontanous Breathing and Patient connected to face mask oxygen  Post-op Assessment: Report given to RN and Post -op Vital signs reviewed and stable  Post vital signs: Reviewed and stable  Last Vitals:  Vitals Value Taken Time  BP 146/93 05/14/24 12:42  Temp 36.6 C 05/14/24 12:39  Pulse 91 05/14/24 12:42  Resp 16 05/14/24 12:42  SpO2 97 % 05/14/24 12:42  Vitals shown include unfiled device data.  Last Pain:  Vitals:   05/14/24 1239  TempSrc:   PainSc: 0-No pain         Complications: No notable events documented.

## 2024-05-14 NOTE — Anesthesia Postprocedure Evaluation (Signed)
 Anesthesia Post Note  Patient: SYD NEWSOME  Procedure(s) Performed: CYSTOSCOPY/URETEROSCOPY/HOLMIUM LASER/STENT PLACEMENT (Left: Ureter)  Patient location during evaluation: PACU Anesthesia Type: General Level of consciousness: awake and alert Pain management: pain level controlled Vital Signs Assessment: post-procedure vital signs reviewed and stable Respiratory status: spontaneous breathing, nonlabored ventilation, respiratory function stable and patient connected to nasal cannula oxygen Cardiovascular status: blood pressure returned to baseline and stable Postop Assessment: no apparent nausea or vomiting Anesthetic complications: no   No notable events documented.   Last Vitals:  Vitals:   05/14/24 1309 05/14/24 1326  BP:  (!) 149/99  Pulse: 81 82  Resp: 11 12  Temp: (!) 36.3 C 36.5 C  SpO2: 97% 100%    Last Pain:  Vitals:   05/14/24 1326  TempSrc: Temporal  PainSc: 0-No pain                 Debby Mines

## 2024-05-14 NOTE — H&P (Addendum)
   05/14/24 11:28 AM   [place] LITTIE Sharps May 22, 1976 [id number]  CC: Left ureteral stone  HPI: 48 year old male who previously presented on 04/18/2024 with left-sided abdominal pain, fever, concern for UTI, 6 mm left distal ureteral stone and underwent urgent left ureteral stent placement.  Culture was ultimately negative.  He was hospitalized again on 04/28/2024 for right sided pain with CT showing a 2 mm right distal ureteral stone, culture was again negative.  Here today for definitive treatment with ureteroscopy.   PMH: Past Medical History:  Diagnosis Date   History of kidney stones    Hypertension     Surgical History: Past Surgical History:  Procedure Laterality Date   CYSTOSCOPY W/ URETERAL STENT PLACEMENT Left 04/18/2024   Procedure: CYSTOSCOPY, WITH RETROGRADE PYELOGRAM AND URETERAL STENT INSERTION;  Surgeon: Francisca Redell BROCKS, MD;  Location: [facility] ORS;  Service: Urology;  Laterality: Left;     Family History: No family history on file.  Social History:  reports that he has been smoking cigarettes. He started smoking about 24 years ago. He has a 12.5 pack-year smoking history. He has never used smokeless tobacco. He reports that he does not currently use alcohol. He reports that he does not use drugs.  Physical Exam:  Constitutional:  Alert and oriented, No acute distress. Cardiovascular: Regular rate and rhythm Respiratory: Clear to auscultation bilaterally GI: Abdomen is soft, nontender, nondistended, no abdominal masses   Laboratory Data: Culture 11/22 no growth   Assessment & Plan:   48 year old male with 6 mm left distal ureteral stone previously stented for possible infection, also likely spontaneously passed a 2 mm right-sided stone since that time.  Here today for definitive management.  We specifically discussed the risks ureteroscopy including bleeding, infection/sepsis, stent related symptoms including flank pain/urgency/frequency/incontinence/dysuria,  ureteral injury, ureteral stricture, inability to access stone, or need for staged or additional procedures.  Left ureteroscopy, laser lithotripsy, stent change  Redell Francisca, MD 05/14/2024  [facility] [address], [address] [place], [place] [address] [phone]

## 2024-05-14 NOTE — Anesthesia Preprocedure Evaluation (Addendum)
 Anesthesia Evaluation  Patient identified by MRN, date of birth, ID band Patient awake    Reviewed: Allergy & Precautions, H&P , NPO status , Patient's Chart, lab work & pertinent test results, reviewed documented beta blocker date and time   Airway Mallampati: III  TM Distance: >3 FB Neck ROM: full    Dental  (+) Teeth Intact   Pulmonary Current Smoker   Pulmonary exam normal        Cardiovascular Exercise Tolerance: Good hypertension, On Medications Normal cardiovascular exam Rhythm:regular Rate:Normal  ECG 04/30/24: SR; LAE; nonspecific T abnormalities in lateral leads; baseline wander in V3   Neuro/Psych negative neurological ROS  negative psych ROS   GI/Hepatic negative GI ROS, Neg liver ROS,,,  Endo/Other  negative endocrine ROS    Renal/GU      Musculoskeletal   Abdominal   Peds  Hematology negative hematology ROS (+)   Anesthesia Other Findings Past Medical History: No date: Hypertension History reviewed. No pertinent surgical history. BMI    Body Mass Index: 21.18 kg/m     Reproductive/Obstetrics negative OB ROS                              Anesthesia Physical Anesthesia Plan  ASA: 2  Anesthesia Plan: General   Post-op Pain Management:    Induction: Intravenous  PONV Risk Score and Plan:   Airway Management Planned: Oral ETT  Additional Equipment:   Intra-op Plan:   Post-operative Plan: Extubation in OR  Informed Consent: I have reviewed the patients History and Physical, chart, labs and discussed the procedure including the risks, benefits and alternatives for the proposed anesthesia with the patient or authorized representative who has indicated his/her understanding and acceptance.     Dental Advisory Given  Plan Discussed with: Anesthesiologist, CRNA and Surgeon  Anesthesia Plan Comments: (Patient consented for risks of anesthesia including but not  limited to:  - adverse reactions to medications - damage to eyes, teeth, lips or other oral mucosa - nerve damage due to positioning  - sore throat or hoarseness - Damage to heart, brain, nerves, lungs, other parts of body or loss of life  Patient voiced understanding and assent.)         Anesthesia Quick Evaluation

## 2024-05-15 ENCOUNTER — Encounter: Payer: Self-pay | Admitting: Urology

## 2024-05-25 LAB — STONE ANALYSIS
Ammonium Acid Urate Calculi: 90 %
Uric Acid Calculi: 10 %
Weight Calculi: <1 mg

## 2024-05-29 ENCOUNTER — Ambulatory Visit: Payer: Self-pay | Admitting: Urology

## 2024-05-29 NOTE — Telephone Encounter (Signed)
-----   Message from Redell Burnet, MD sent at 05/29/2024  8:04 AM EST ----- Your kidney stone was ammonium acid urate-this is a rare type of kidney stone.  The most important steps for prevention are drinking plenty of fluids(water  best), adding lemon juice or Crystal light lemonade to the water , decreasing meat and animal protein intake, and adding  half teaspoon baking soda to 8 ounces of water  daily to prevent further stones  I would recommend 24-hour urine metabolic workup in 3 months to confirm these changes are working and prevent further stone events  Redell Burnet, MD 05/29/2024

## 2024-05-29 NOTE — Telephone Encounter (Signed)
 Called pt no answer. Unable to leave voicemail as VM is full. 1st attempt.

## 2024-06-01 ENCOUNTER — Other Ambulatory Visit: Payer: Self-pay

## 2024-06-04 NOTE — Telephone Encounter (Signed)
-----   Message from Santa Gang, [place] sent at 05/29/2024  1:07 PM EST -----   ----- Message ----- From: Francisca Redell BROCKS, MD Sent: 05/29/2024   8:04 AM EST To: Santa Gang, CMA  Your kidney stone was ammonium acid urate-this is a rare type of kidney stone.  The most important steps for prevention are drinking plenty of fluids(water  best), adding lemon juice or Crystal light lemonade to the water , decreasing meat and animal protein intake, and adding  half teaspoon baking soda to 8 ounces of water  daily to prevent further stones  I would recommend 24-hour urine metabolic workup in 3 months to confirm these changes are working and prevent further stone events  Redell Francisca, MD 05/29/2024

## 2024-06-04 NOTE — Telephone Encounter (Signed)
 Called pt, no answer. Unable to leave message as VM is full. 2nd attempt.

## 2024-06-11 NOTE — Telephone Encounter (Signed)
 Letter mailed
